# Patient Record
Sex: Female | Born: 1940
Health system: Southern US, Community
[De-identification: ages and names within clinical notes are randomized; demographics above are authoritative.]

## PROBLEM LIST (undated history)

## (undated) DIAGNOSIS — I10 Essential (primary) hypertension: Secondary | ICD-10-CM

## (undated) DIAGNOSIS — E7800 Pure hypercholesterolemia, unspecified: Secondary | ICD-10-CM

## (undated) DIAGNOSIS — E039 Hypothyroidism, unspecified: Secondary | ICD-10-CM

## (undated) HISTORY — DX: Hypothyroidism, unspecified: E03.9

## (undated) HISTORY — DX: Essential (primary) hypertension: I10

## (undated) HISTORY — PX: CATARACT EXTRACTION: SUR2

## (undated) HISTORY — DX: Pure hypercholesterolemia, unspecified: E78.00

---

## 2002-04-10 ENCOUNTER — Ambulatory Visit (HOSPITAL_COMMUNITY): Admission: RE | Admit: 2002-04-10 | Discharge: 2002-04-10 | Payer: Self-pay | Admitting: Unknown Physician Specialty

## 2002-04-10 ENCOUNTER — Encounter: Payer: Self-pay | Admitting: Unknown Physician Specialty

## 2003-04-16 ENCOUNTER — Ambulatory Visit (HOSPITAL_COMMUNITY): Admission: RE | Admit: 2003-04-16 | Discharge: 2003-04-16 | Payer: Self-pay | Admitting: Unknown Physician Specialty

## 2003-04-16 ENCOUNTER — Encounter: Payer: Self-pay | Admitting: Unknown Physician Specialty

## 2004-04-18 ENCOUNTER — Ambulatory Visit (HOSPITAL_COMMUNITY): Admission: RE | Admit: 2004-04-18 | Discharge: 2004-04-18 | Payer: Self-pay | Admitting: Unknown Physician Specialty

## 2005-04-20 ENCOUNTER — Ambulatory Visit (HOSPITAL_COMMUNITY): Admission: RE | Admit: 2005-04-20 | Discharge: 2005-04-20 | Payer: Self-pay | Admitting: Internal Medicine

## 2006-04-22 ENCOUNTER — Ambulatory Visit (HOSPITAL_COMMUNITY): Admission: RE | Admit: 2006-04-22 | Discharge: 2006-04-22 | Payer: Self-pay | Admitting: Internal Medicine

## 2007-04-24 ENCOUNTER — Ambulatory Visit (HOSPITAL_COMMUNITY): Admission: RE | Admit: 2007-04-24 | Discharge: 2007-04-24 | Payer: Self-pay | Admitting: Internal Medicine

## 2008-04-28 ENCOUNTER — Ambulatory Visit (HOSPITAL_COMMUNITY): Admission: RE | Admit: 2008-04-28 | Discharge: 2008-04-28 | Payer: Self-pay | Admitting: Internal Medicine

## 2009-05-02 ENCOUNTER — Ambulatory Visit (HOSPITAL_COMMUNITY): Admission: RE | Admit: 2009-05-02 | Discharge: 2009-05-02 | Payer: Self-pay | Admitting: Internal Medicine

## 2009-06-13 ENCOUNTER — Ambulatory Visit (HOSPITAL_COMMUNITY): Admission: RE | Admit: 2009-06-13 | Discharge: 2009-06-13 | Payer: Self-pay | Admitting: Ophthalmology

## 2010-05-04 ENCOUNTER — Ambulatory Visit (HOSPITAL_COMMUNITY): Admission: RE | Admit: 2010-05-04 | Discharge: 2010-05-04 | Payer: Self-pay | Admitting: Internal Medicine

## 2010-06-19 ENCOUNTER — Ambulatory Visit (HOSPITAL_COMMUNITY): Admission: RE | Admit: 2010-06-19 | Discharge: 2010-06-19 | Payer: Self-pay | Admitting: Ophthalmology

## 2010-10-10 LAB — BASIC METABOLIC PANEL
Chloride: 106 mEq/L (ref 96–112)
Creatinine, Ser: 0.73 mg/dL (ref 0.4–1.2)
GFR calc Af Amer: >60 mL/min (ref >60)

## 2010-10-10 LAB — HEMOGLOBIN AND HEMATOCRIT, BLOOD
HCT: 39.9 % (ref 36.0–46.0)
Hemoglobin: 13.5 g/dL (ref 12.0–15.0)

## 2010-11-01 LAB — HEMOGLOBIN AND HEMATOCRIT, BLOOD: Hemoglobin: 13.7 g/dL (ref 12.0–15.0)

## 2010-11-01 LAB — BASIC METABOLIC PANEL
BUN: 11 mg/dL (ref 6–23)
Creatinine, Ser: 0.73 mg/dL (ref 0.4–1.2)
GFR calc non Af Amer: >60 mL/min (ref >60)
Potassium: 4 mEq/L (ref 3.5–5.1)

## 2011-04-02 ENCOUNTER — Other Ambulatory Visit (HOSPITAL_COMMUNITY): Payer: Self-pay | Admitting: Internal Medicine

## 2011-04-02 DIAGNOSIS — Z139 Encounter for screening, unspecified: Secondary | ICD-10-CM

## 2011-05-08 ENCOUNTER — Ambulatory Visit (HOSPITAL_COMMUNITY)
Admission: RE | Admit: 2011-05-08 | Discharge: 2011-05-08 | Disposition: A | Discharge status: Home / Self Care | Payer: Medicare Other | Source: Ambulatory Visit | Attending: Internal Medicine | Admitting: Internal Medicine

## 2011-05-08 DIAGNOSIS — Z139 Encounter for screening, unspecified: Secondary | ICD-10-CM

## 2011-05-08 DIAGNOSIS — Z1231 Encounter for screening mammogram for malignant neoplasm of breast: Secondary | ICD-10-CM | POA: Insufficient documentation

## 2012-04-03 ENCOUNTER — Other Ambulatory Visit (HOSPITAL_COMMUNITY): Payer: Self-pay | Admitting: Internal Medicine

## 2012-04-03 DIAGNOSIS — Z139 Encounter for screening, unspecified: Secondary | ICD-10-CM

## 2012-05-12 ENCOUNTER — Ambulatory Visit (HOSPITAL_COMMUNITY)
Admission: RE | Admit: 2012-05-12 | Discharge: 2012-05-12 | Disposition: A | Discharge status: Home / Self Care | Payer: Medicare Other | Source: Ambulatory Visit | Attending: Internal Medicine | Admitting: Internal Medicine

## 2012-05-12 DIAGNOSIS — Z139 Encounter for screening, unspecified: Secondary | ICD-10-CM

## 2012-05-12 DIAGNOSIS — Z1231 Encounter for screening mammogram for malignant neoplasm of breast: Secondary | ICD-10-CM | POA: Insufficient documentation

## 2012-05-16 ENCOUNTER — Other Ambulatory Visit: Payer: Self-pay | Admitting: Internal Medicine

## 2012-05-16 DIAGNOSIS — R928 Other abnormal and inconclusive findings on diagnostic imaging of breast: Secondary | ICD-10-CM

## 2012-06-04 ENCOUNTER — Ambulatory Visit (HOSPITAL_COMMUNITY)
Admission: RE | Admit: 2012-06-04 | Discharge: 2012-06-04 | Disposition: A | Discharge status: Home / Self Care | Payer: Medicare Other | Source: Ambulatory Visit | Attending: Internal Medicine | Admitting: Internal Medicine

## 2012-06-04 DIAGNOSIS — R928 Other abnormal and inconclusive findings on diagnostic imaging of breast: Secondary | ICD-10-CM | POA: Insufficient documentation

## 2012-11-03 ENCOUNTER — Other Ambulatory Visit (HOSPITAL_COMMUNITY): Payer: Self-pay | Admitting: Internal Medicine

## 2012-11-03 DIAGNOSIS — Z139 Encounter for screening, unspecified: Secondary | ICD-10-CM

## 2012-11-03 DIAGNOSIS — Z09 Encounter for follow-up examination after completed treatment for conditions other than malignant neoplasm: Secondary | ICD-10-CM

## 2012-12-10 ENCOUNTER — Ambulatory Visit (HOSPITAL_COMMUNITY)
Admission: RE | Admit: 2012-12-10 | Discharge: 2012-12-10 | Disposition: A | Discharge status: Home / Self Care | Payer: Medicare Other | Source: Ambulatory Visit | Attending: Internal Medicine | Admitting: Internal Medicine

## 2012-12-10 DIAGNOSIS — R928 Other abnormal and inconclusive findings on diagnostic imaging of breast: Secondary | ICD-10-CM | POA: Insufficient documentation

## 2012-12-10 DIAGNOSIS — Z09 Encounter for follow-up examination after completed treatment for conditions other than malignant neoplasm: Secondary | ICD-10-CM

## 2013-05-04 ENCOUNTER — Other Ambulatory Visit (HOSPITAL_COMMUNITY): Payer: Self-pay | Admitting: Internal Medicine

## 2013-05-04 DIAGNOSIS — R928 Other abnormal and inconclusive findings on diagnostic imaging of breast: Secondary | ICD-10-CM

## 2013-05-04 DIAGNOSIS — Z09 Encounter for follow-up examination after completed treatment for conditions other than malignant neoplasm: Secondary | ICD-10-CM

## 2013-06-17 ENCOUNTER — Ambulatory Visit (HOSPITAL_COMMUNITY)
Admission: RE | Admit: 2013-06-17 | Discharge: 2013-06-17 | Disposition: A | Discharge status: Home / Self Care | Payer: Medicare Other | Source: Ambulatory Visit | Attending: Internal Medicine | Admitting: Internal Medicine

## 2013-06-17 DIAGNOSIS — Z09 Encounter for follow-up examination after completed treatment for conditions other than malignant neoplasm: Secondary | ICD-10-CM | POA: Insufficient documentation

## 2013-06-17 DIAGNOSIS — R928 Other abnormal and inconclusive findings on diagnostic imaging of breast: Secondary | ICD-10-CM

## 2014-05-18 ENCOUNTER — Other Ambulatory Visit (HOSPITAL_COMMUNITY): Payer: Self-pay | Admitting: Internal Medicine

## 2014-05-18 DIAGNOSIS — Z09 Encounter for follow-up examination after completed treatment for conditions other than malignant neoplasm: Secondary | ICD-10-CM

## 2014-05-18 DIAGNOSIS — R921 Mammographic calcification found on diagnostic imaging of breast: Secondary | ICD-10-CM

## 2014-06-29 ENCOUNTER — Ambulatory Visit (HOSPITAL_COMMUNITY)
Admission: RE | Admit: 2014-06-29 | Discharge: 2014-06-29 | Disposition: A | Discharge status: Home / Self Care | Payer: Medicare Other | Source: Ambulatory Visit | Attending: Internal Medicine | Admitting: Internal Medicine

## 2014-06-29 DIAGNOSIS — R921 Mammographic calcification found on diagnostic imaging of breast: Secondary | ICD-10-CM | POA: Insufficient documentation

## 2014-06-29 DIAGNOSIS — Z09 Encounter for follow-up examination after completed treatment for conditions other than malignant neoplasm: Secondary | ICD-10-CM | POA: Diagnosis present

## 2015-06-08 ENCOUNTER — Other Ambulatory Visit (HOSPITAL_COMMUNITY): Payer: Self-pay | Admitting: Internal Medicine

## 2015-06-08 DIAGNOSIS — Z1231 Encounter for screening mammogram for malignant neoplasm of breast: Secondary | ICD-10-CM

## 2015-07-04 ENCOUNTER — Ambulatory Visit (HOSPITAL_COMMUNITY)
Admission: RE | Admit: 2015-07-04 | Discharge: 2015-07-04 | Disposition: A | Discharge status: Home / Self Care | Payer: Medicare Other | Source: Ambulatory Visit | Attending: Internal Medicine | Admitting: Internal Medicine

## 2015-07-04 DIAGNOSIS — Z1231 Encounter for screening mammogram for malignant neoplasm of breast: Secondary | ICD-10-CM | POA: Diagnosis not present

## 2015-11-01 DIAGNOSIS — Z713 Dietary counseling and surveillance: Secondary | ICD-10-CM | POA: Diagnosis not present

## 2015-11-01 DIAGNOSIS — Z6829 Body mass index (BMI) 29.0-29.9, adult: Secondary | ICD-10-CM | POA: Diagnosis not present

## 2015-11-01 DIAGNOSIS — R21 Rash and other nonspecific skin eruption: Secondary | ICD-10-CM | POA: Diagnosis not present

## 2015-11-01 DIAGNOSIS — Z87891 Personal history of nicotine dependence: Secondary | ICD-10-CM | POA: Diagnosis not present

## 2016-01-09 DIAGNOSIS — L239 Allergic contact dermatitis, unspecified cause: Secondary | ICD-10-CM | POA: Diagnosis not present

## 2016-01-09 DIAGNOSIS — Z299 Encounter for prophylactic measures, unspecified: Secondary | ICD-10-CM | POA: Diagnosis not present

## 2016-01-27 DIAGNOSIS — E039 Hypothyroidism, unspecified: Secondary | ICD-10-CM | POA: Diagnosis not present

## 2016-02-03 DIAGNOSIS — E785 Hyperlipidemia, unspecified: Secondary | ICD-10-CM | POA: Diagnosis not present

## 2016-02-03 DIAGNOSIS — I10 Essential (primary) hypertension: Secondary | ICD-10-CM | POA: Diagnosis not present

## 2016-05-31 ENCOUNTER — Other Ambulatory Visit (HOSPITAL_COMMUNITY): Payer: Self-pay | Admitting: Internal Medicine

## 2016-05-31 DIAGNOSIS — Z1231 Encounter for screening mammogram for malignant neoplasm of breast: Secondary | ICD-10-CM

## 2016-06-05 DIAGNOSIS — Z23 Encounter for immunization: Secondary | ICD-10-CM | POA: Diagnosis not present

## 2016-07-05 ENCOUNTER — Ambulatory Visit (HOSPITAL_COMMUNITY)
Admission: RE | Admit: 2016-07-05 | Discharge: 2016-07-05 | Disposition: A | Discharge status: Home / Self Care | Payer: Medicare Other | Source: Ambulatory Visit | Attending: Internal Medicine | Admitting: Internal Medicine

## 2016-07-05 DIAGNOSIS — Z1231 Encounter for screening mammogram for malignant neoplasm of breast: Secondary | ICD-10-CM | POA: Diagnosis not present

## 2016-08-03 DIAGNOSIS — Z6829 Body mass index (BMI) 29.0-29.9, adult: Secondary | ICD-10-CM | POA: Diagnosis not present

## 2016-08-03 DIAGNOSIS — Z7189 Other specified counseling: Secondary | ICD-10-CM | POA: Diagnosis not present

## 2016-08-03 DIAGNOSIS — Z299 Encounter for prophylactic measures, unspecified: Secondary | ICD-10-CM | POA: Diagnosis not present

## 2016-08-03 DIAGNOSIS — Z1211 Encounter for screening for malignant neoplasm of colon: Secondary | ICD-10-CM | POA: Diagnosis not present

## 2016-08-03 DIAGNOSIS — Z1389 Encounter for screening for other disorder: Secondary | ICD-10-CM | POA: Diagnosis not present

## 2016-08-03 DIAGNOSIS — Z0000 Encounter for general adult medical examination without abnormal findings: Secondary | ICD-10-CM | POA: Diagnosis not present

## 2016-08-03 DIAGNOSIS — Z79899 Other long term (current) drug therapy: Secondary | ICD-10-CM | POA: Diagnosis not present

## 2016-08-03 DIAGNOSIS — E785 Hyperlipidemia, unspecified: Secondary | ICD-10-CM | POA: Diagnosis not present

## 2016-08-03 DIAGNOSIS — E039 Hypothyroidism, unspecified: Secondary | ICD-10-CM | POA: Diagnosis not present

## 2016-08-10 DIAGNOSIS — E2839 Other primary ovarian failure: Secondary | ICD-10-CM | POA: Diagnosis not present

## 2016-11-02 DIAGNOSIS — Z79899 Other long term (current) drug therapy: Secondary | ICD-10-CM | POA: Diagnosis not present

## 2017-01-29 DIAGNOSIS — E039 Hypothyroidism, unspecified: Secondary | ICD-10-CM | POA: Diagnosis not present

## 2017-03-06 DIAGNOSIS — E039 Hypothyroidism, unspecified: Secondary | ICD-10-CM | POA: Diagnosis not present

## 2017-03-06 DIAGNOSIS — Z6828 Body mass index (BMI) 28.0-28.9, adult: Secondary | ICD-10-CM | POA: Diagnosis not present

## 2017-03-06 DIAGNOSIS — Z299 Encounter for prophylactic measures, unspecified: Secondary | ICD-10-CM | POA: Diagnosis not present

## 2017-03-06 DIAGNOSIS — I10 Essential (primary) hypertension: Secondary | ICD-10-CM | POA: Diagnosis not present

## 2017-03-06 DIAGNOSIS — E785 Hyperlipidemia, unspecified: Secondary | ICD-10-CM | POA: Diagnosis not present

## 2017-03-13 DIAGNOSIS — I10 Essential (primary) hypertension: Secondary | ICD-10-CM | POA: Diagnosis not present

## 2017-03-13 DIAGNOSIS — E785 Hyperlipidemia, unspecified: Secondary | ICD-10-CM | POA: Diagnosis not present

## 2017-03-13 DIAGNOSIS — Z299 Encounter for prophylactic measures, unspecified: Secondary | ICD-10-CM | POA: Diagnosis not present

## 2017-03-13 DIAGNOSIS — E039 Hypothyroidism, unspecified: Secondary | ICD-10-CM | POA: Diagnosis not present

## 2017-03-13 DIAGNOSIS — R195 Other fecal abnormalities: Secondary | ICD-10-CM | POA: Diagnosis not present

## 2017-03-13 DIAGNOSIS — Z6828 Body mass index (BMI) 28.0-28.9, adult: Secondary | ICD-10-CM | POA: Diagnosis not present

## 2017-03-15 ENCOUNTER — Encounter (INDEPENDENT_AMBULATORY_CARE_PROVIDER_SITE_OTHER): Payer: Self-pay | Admitting: *Deleted

## 2017-03-27 ENCOUNTER — Encounter (INDEPENDENT_AMBULATORY_CARE_PROVIDER_SITE_OTHER): Payer: Self-pay

## 2017-03-27 ENCOUNTER — Encounter (INDEPENDENT_AMBULATORY_CARE_PROVIDER_SITE_OTHER): Payer: Self-pay | Admitting: Internal Medicine

## 2017-04-10 ENCOUNTER — Other Ambulatory Visit (INDEPENDENT_AMBULATORY_CARE_PROVIDER_SITE_OTHER): Payer: Self-pay | Admitting: Internal Medicine

## 2017-04-10 ENCOUNTER — Encounter (INDEPENDENT_AMBULATORY_CARE_PROVIDER_SITE_OTHER): Payer: Self-pay | Admitting: Internal Medicine

## 2017-04-10 ENCOUNTER — Telehealth (INDEPENDENT_AMBULATORY_CARE_PROVIDER_SITE_OTHER): Payer: Self-pay | Admitting: *Deleted

## 2017-04-10 ENCOUNTER — Ambulatory Visit (INDEPENDENT_AMBULATORY_CARE_PROVIDER_SITE_OTHER): Payer: Medicare Other | Admitting: Internal Medicine

## 2017-04-10 ENCOUNTER — Encounter (INDEPENDENT_AMBULATORY_CARE_PROVIDER_SITE_OTHER): Payer: Self-pay | Admitting: *Deleted

## 2017-04-10 VITALS — BP 150/66 | HR 80 | Temp 98.6°F | Ht 65.0 in | Wt 166.6 lb

## 2017-04-10 DIAGNOSIS — R195 Other fecal abnormalities: Secondary | ICD-10-CM | POA: Insufficient documentation

## 2017-04-10 DIAGNOSIS — E039 Hypothyroidism, unspecified: Secondary | ICD-10-CM

## 2017-04-10 DIAGNOSIS — E7800 Pure hypercholesterolemia, unspecified: Secondary | ICD-10-CM

## 2017-04-10 DIAGNOSIS — I10 Essential (primary) hypertension: Secondary | ICD-10-CM

## 2017-04-10 HISTORY — DX: Pure hypercholesterolemia, unspecified: E78.00

## 2017-04-10 HISTORY — DX: Essential (primary) hypertension: I10

## 2017-04-10 HISTORY — DX: Hypothyroidism, unspecified: E03.9

## 2017-04-10 MED ORDER — PEG 3350-KCL-NA BICARB-NACL 420 G PO SOLR: 4000.0000 mL | Freq: Once | ORAL | 0 refills | Status: AC

## 2017-04-10 NOTE — Patient Instructions (Signed)
Colonoscopy. The risks of bleeding, perforation and infection were reviewed with patient.  

## 2017-04-10 NOTE — Progress Notes (Signed)
   Subjective:    Patient ID: April Duran, female    DOB: 1940-12-27, 76 y.o.   MRN: [id number]  HPI Referred by Dr. Manuella Ghazi for heme positive stool. She denies seeing any blood. No change in her stools. No family hx of colon cancer. No weight loss. Appetite is good. Has never undergone a colonoscopy in the past. She did have a rectal exam at Dr. Trena Platt Arsenio Katz NP) and was positive. She takes a Baby ASA daily. She was taking Ibuprofen x 4 while her husband was in the [place] in July.     Review of Systems Past Medical History:  Diagnosis Date  . Essential hypertension, benign 04/10/2017  . High cholesterol 04/10/2017  . Hypothyroidism 04/10/2017    No past surgical history on file.  Allergies no known allergies  No current outpatient prescriptions on file prior to visit.   No current facility-administered medications on file prior to visit.    Current Outpatient Prescriptions  Medication Sig Dispense Refill  . Ascorbic Acid (VITAMIN C) 1000 MG tablet Take 1,000 mg by mouth daily.    Marland Kitchen aspirin 81 MG tablet Take 81 mg by mouth daily.    Marland Kitchen b complex vitamins tablet Take 1 tablet by mouth daily.    . Biotin 10 MG CAPS Take by mouth.    . Calcium Carbonate-Vit D-Min (CALCIUM 1200 PO) Take 600 mg by mouth.    . cholecalciferol (VITAMIN D) 1000 units tablet Take 2,000 Units by mouth daily.    Marland Kitchen docusate sodium (COLACE) 100 MG capsule Take 100 mg by mouth 2 (two) times daily.    Marland Kitchen levothyroxine (SYNTHROID, LEVOTHROID) 50 MCG tablet Take 50 mcg by mouth daily before breakfast.    . lisinopril (PRINIVIL,ZESTRIL) 20 MG tablet Take 20 mg by mouth daily.    Marland Kitchen lovastatin (MEVACOR) 20 MG tablet Take 20 mg by mouth at bedtime.    . Multiple Vitamin (MULTIVITAMIN) tablet Take 1 tablet by mouth daily.    . Omega-3 Fatty Acids (FISH OIL) 1200 MG CAPS Take by mouth.    . senna (SENOKOT) 8.6 MG tablet Take 1 tablet by mouth daily.    . vitamin B-12 (CYANOCOBALAMIN) 100 MCG tablet Take 5,000 mcg by  mouth daily.    . vitamin E 400 UNIT capsule Take 400 Units by mouth daily.     No current facility-administered medications for this visit.          Objective:   Physical Exam Blood pressure (!) 150/66, pulse 80, temperature 98.6 F (37 C), height 5\' 5"  (1.651 m), weight 166 lb 9.6 oz (75.6 kg). Alert and oriented. Skin warm and dry. Oral mucosa is moist.   . Sclera anicteric, conjunctivae is pink. Thyroid not enlarged. No cervical lymphadenopathy. Lungs clear. Heart regular rate and rhythm.  Abdomen is soft. Bowel sounds are positive. No hepatomegaly. No abdominal masses felt. No tenderness.  No edema to lower extremities.          Assessment & Plan:  Guaiac positive. Colonic neoplasm, polyp, AVM needs to be ruled out.

## 2017-04-10 NOTE — Telephone Encounter (Signed)
Patient needs trilyte 

## 2017-04-18 ENCOUNTER — Encounter (HOSPITAL_COMMUNITY)
Admission: RE | Disposition: A | Discharge status: Home / Self Care | Payer: Self-pay | Source: Ambulatory Visit | Attending: Internal Medicine

## 2017-04-18 ENCOUNTER — Encounter (HOSPITAL_COMMUNITY): Payer: Self-pay | Admitting: *Deleted

## 2017-04-18 ENCOUNTER — Ambulatory Visit (HOSPITAL_COMMUNITY)
Admission: RE | Admit: 2017-04-18 | Discharge: 2017-04-18 | Disposition: A | Discharge status: Home / Self Care | Payer: Medicare Other | Source: Ambulatory Visit | Attending: Internal Medicine | Admitting: Internal Medicine

## 2017-04-18 DIAGNOSIS — K5730 Diverticulosis of large intestine without perforation or abscess without bleeding: Secondary | ICD-10-CM | POA: Diagnosis not present

## 2017-04-18 DIAGNOSIS — R195 Other fecal abnormalities: Secondary | ICD-10-CM | POA: Insufficient documentation

## 2017-04-18 DIAGNOSIS — D122 Benign neoplasm of ascending colon: Secondary | ICD-10-CM | POA: Insufficient documentation

## 2017-04-18 DIAGNOSIS — K6289 Other specified diseases of anus and rectum: Secondary | ICD-10-CM | POA: Diagnosis not present

## 2017-04-18 DIAGNOSIS — Z79899 Other long term (current) drug therapy: Secondary | ICD-10-CM | POA: Insufficient documentation

## 2017-04-18 DIAGNOSIS — K6389 Other specified diseases of intestine: Secondary | ICD-10-CM | POA: Diagnosis not present

## 2017-04-18 DIAGNOSIS — E039 Hypothyroidism, unspecified: Secondary | ICD-10-CM | POA: Diagnosis not present

## 2017-04-18 DIAGNOSIS — E7800 Pure hypercholesterolemia, unspecified: Secondary | ICD-10-CM | POA: Diagnosis not present

## 2017-04-18 DIAGNOSIS — K644 Residual hemorrhoidal skin tags: Secondary | ICD-10-CM | POA: Diagnosis not present

## 2017-04-18 DIAGNOSIS — I10 Essential (primary) hypertension: Secondary | ICD-10-CM | POA: Diagnosis not present

## 2017-04-18 DIAGNOSIS — Z7982 Long term (current) use of aspirin: Secondary | ICD-10-CM | POA: Insufficient documentation

## 2017-04-18 HISTORY — PX: COLONOSCOPY: SHX5424

## 2017-04-18 HISTORY — PX: POLYPECTOMY: SHX5525

## 2017-04-18 SURGERY — COLONOSCOPY
Anesthesia: Moderate Sedation

## 2017-04-18 MED ORDER — MIDAZOLAM HCL 5 MG/5ML IJ SOLN
INTRAMUSCULAR | Status: DC | PRN
  Administered 2017-04-18: 1 mg via INTRAVENOUS
  Administered 2017-04-18: 2 mg via INTRAVENOUS
  Administered 2017-04-18: 1 mg via INTRAVENOUS

## 2017-04-18 MED ORDER — SODIUM CHLORIDE 0.9 % IV SOLN
INTRAVENOUS | Status: DC
Start: 2017-04-18 — End: 2017-04-18
  Administered 2017-04-18: 10:00:00 via INTRAVENOUS

## 2017-04-18 MED ORDER — MEPERIDINE HCL 50 MG/ML IJ SOLN
INTRAMUSCULAR | Status: DC | PRN
  Administered 2017-04-18 (×2): 25 mg via INTRAVENOUS

## 2017-04-18 MED ORDER — MEPERIDINE HCL 50 MG/ML IJ SOLN
INTRAMUSCULAR | Status: AC
  Filled 2017-04-18: qty 1

## 2017-04-18 MED ORDER — MIDAZOLAM HCL 5 MG/5ML IJ SOLN
INTRAMUSCULAR | Status: AC
  Filled 2017-04-18: qty 10

## 2017-04-18 NOTE — Op Note (Signed)
[facility] Patient Name: April Duran Procedure Date: 04/18/2017 9:48 AM MRN: [id number] Date of Birth: Mar 07, 1941 Attending MD: Hildred Laser , MD CSN: [id number] Age: 76 Admit Type: Outpatient Procedure:                Colonoscopy Indications:              Heme positive stool Providers:                Hildred Laser, MD, Lurline Del, RN, Zoila Shutter,                            Technologist Referring MD:             Fuller Canada Manuella Ghazi, MD Medicines:                Meperidine 50 mg IV, Midazolam 4 mg IV Complications:            No immediate complications. Estimated Blood Loss:     Estimated blood loss was minimal. Procedure:                Pre-Anesthesia Assessment:                           - Prior to the procedure, a History and Physical                            was performed, and patient medications and                            allergies were reviewed. The patient's tolerance of                            previous anesthesia was also reviewed. The risks                            and benefits of the procedure and the sedation                            options and risks were discussed with the patient.                            All questions were answered, and informed consent                            was obtained. Prior Anticoagulants: The patient                            last took aspirin 3 days prior to the procedure.                            ASA Grade Assessment: II - A patient with mild                            systemic disease. After reviewing the risks and  benefits, the patient was deemed in satisfactory                            condition to undergo the procedure.                           After obtaining informed consent, the colonoscope                            was passed under direct vision. Throughout the                            procedure, the patient's blood pressure, pulse, and                            oxygen  saturations were monitored continuously. The                            [id number] ([id number]) scope was introduced through                            the anus and advanced to the the cecum, identified                            by appendiceal orifice and ileocecal valve. The                            colonoscopy was performed without difficulty. The                            patient tolerated the procedure well. The quality                            of the bowel preparation was excellent. The                            ileocecal valve, appendiceal orifice, and rectum                            were photographed. Scope In: 10:13:07 AM Scope Out: 10:35:39 AM Scope Withdrawal Time: 0 hours 11 minutes 41 seconds  Total Procedure Duration: 0 hours 22 minutes 32 seconds  Findings:      The perianal and digital rectal examinations were normal.      A diffuse area of mild melanosis was found in the entire colon.      A small polyp was found in the ascending colon. The polyp was sessile.       Biopsies were taken with a cold forceps for histology.      A few small-mouthed diverticula were found in the sigmoid colon.      External hemorrhoids were found during retroflexion. The hemorrhoids       were small.      Anal papilla(e) were hypertrophied. Impression:               - Melanosis in the colon.                           -  One small polyp in the ascending colon. Biopsied.                           - Diverticulosis in the sigmoid colon.                           - External hemorrhoids.                           - Anal papilla(e) were hypertrophied. Moderate Sedation:      Moderate (conscious) sedation was administered by the endoscopy nurse       and supervised by the endoscopist. The following parameters were       monitored: oxygen saturation, heart rate, blood pressure, CO2       capnography and response to care. Total physician intraservice time was       30 minutes. Recommendation:            - Patient has a contact number available for                            emergencies. The signs and symptoms of potential                            delayed complications were discussed with the                            patient. Return to normal activities tomorrow.                            Written discharge instructions were provided to the                            patient.                           - High fiber diet today.                           - Continue present medications.                           - Resume aspirin at prior dose tomorrow.                           - Await pathology results.                           - No recommendation at this time regarding repeat                            colonoscopy. Procedure Code(s):        --- Professional ---                           [phone], Colonoscopy, flexible; with biopsy, single  or multiple                           99152, Moderate sedation services provided by the                            same physician or other qualified health care                            professional performing the diagnostic or                            therapeutic service that the sedation supports,                            requiring the presence of an independent trained                            observer to assist in the monitoring of the                            patient's level of consciousness and physiological                            status; initial 15 minutes of intraservice time,                            patient age 58 years or older                           [phone], Moderate sedation services; each additional                            15 minutes intraservice time Diagnosis Code(s):        --- Professional ---                           K63.89, Other specified diseases of intestine                           D12.2, Benign neoplasm of ascending colon                           K64.4, Residual  hemorrhoidal skin tags                           K62.89, Other specified diseases of anus and rectum                           R19.5, Other fecal abnormalities                           K57.30, Diverticulosis of large intestine without                            perforation or abscess  without bleeding CPT copyright 2016 American [employer] Association. All rights reserved. The codes documented in this report are preliminary and upon coder review may  be revised to meet current compliance requirements. Hildred Laser, MD Hildred Laser, MD 04/18/2017 10:47:42 AM This report has been signed electronically. Number of Addenda: 0

## 2017-04-18 NOTE — Discharge Instructions (Signed)
Colon Polyps Polyps are tissue growths inside the body. Polyps can grow in many places, including the large intestine (colon). A polyp may be a round bump or a mushroom-shaped growth. You could have one polyp or several. Most colon polyps are noncancerous (benign). However, some colon polyps can become cancerous over time. What are the causes? The exact cause of colon polyps is not known. What increases the risk? This condition is more likely to develop in people who:  Have a family history of colon cancer or colon polyps.  Are older than 34 or older than 45 if they are African American.  Have inflammatory bowel disease, such as ulcerative colitis or Crohn disease.  Are overweight.  Smoke cigarettes.  Do not get enough exercise.  Drink too much alcohol.  Eat a diet that is: ? High in fat and red meat. ? Low in fiber.  Had childhood cancer that was treated with abdominal radiation.  What are the signs or symptoms? Most polyps do not cause symptoms. If you have symptoms, they may include:  Blood coming from your rectum when having a bowel movement.  Blood in your stool.The stool may look dark red or black.  A change in bowel habits, such as constipation or diarrhea.  How is this diagnosed? This condition is diagnosed with a colonoscopy. This is a procedure that uses a lighted, flexible scope to look at the inside of your colon. How is this treated? Treatment for this condition involves removing any polyps that are found. Those polyps will then be tested for cancer. If cancer is found, your health care provider will talk to you about options for colon cancer treatment. Follow these instructions at home: Diet  Eat plenty of fiber, such as fruits, vegetables, and whole grains.  Eat foods that are high in calcium and vitamin D, such as milk, cheese, yogurt, eggs, liver, fish, and broccoli.  Limit foods high in fat, red meats, and processed meats, such as hot dogs, sausage,  bacon, and lunch meats.  Maintain a healthy weight, or lose weight if recommended by your health care provider. General instructions  Do not smoke cigarettes.  Do not drink alcohol excessively.  Keep all follow-up visits as told by your health care provider. This is important. This includes keeping regularly scheduled colonoscopies. Talk to your health care provider about when you need a colonoscopy.  Exercise every day or as told by your health care provider. Contact a health care provider if:  You have new or worsening bleeding during a bowel movement.  You have new or increased blood in your stool.  You have a change in bowel habits.  You unexpectedly lose weight. This information is not intended to replace advice given to you by your health care provider. Make sure you discuss any questions you have with your health care provider. Document Released: 04/11/2004 Document Revised: 12/22/2015 Document Reviewed: 06/06/2015 Elsevier Interactive Patient Education  2018 [employer]. Colonoscopy, Adult, Care After This sheet gives you information about how to care for yourself after your procedure. Your health care provider may also give you more specific instructions. If you have problems or questions, contact your health care provider. What can I expect after the procedure? After the procedure, it is common to have:  A small amount of blood in your stool for 24 hours after the procedure.  Some gas.  Mild abdominal cramping or bloating.  Follow these instructions at home: General instructions   For the first 24 hours after  the procedure: ? Do not drive or use machinery. ? Do not sign important documents. ? Do not drink alcohol. ? Do your regular daily activities at a slower pace than normal. ? Eat soft, easy-to-digest foods. ? Rest often.  Take over-the-counter or prescription medicines only as told by your health care provider.  It is up to you to get the results of  your procedure. Ask your health care provider, or the department performing the procedure, when your results will be ready. Relieving cramping and bloating  Try walking around when you have cramps or feel bloated.  Apply heat to your abdomen as told by your health care provider. Use a heat source that your health care provider recommends, such as a moist heat pack or a heating pad. ? Place a towel between your skin and the heat source. ? Leave the heat on for 20-30 minutes. ? Remove the heat if your skin turns bright red. This is especially important if you are unable to feel pain, heat, or cold. You may have a greater risk of getting burned. Eating and drinking  Drink enough fluid to keep your urine clear or pale yellow.  Resume your normal diet as instructed by your health care provider. Avoid heavy or fried foods that are hard to digest.  Avoid drinking alcohol for as long as instructed by your health care provider. Contact a health care provider if:  You have blood in your stool 2-3 days after the procedure. Get help right away if:  You have more than a small spotting of blood in your stool.  You pass large blood clots in your stool.  Your abdomen is swollen.  You have nausea or vomiting.  You have a fever.  You have increasing abdominal pain that is not relieved with medicine. This information is not intended to replace advice given to you by your health care provider. Make sure you discuss any questions you have with your health care provider. Document Released: 02/28/2004 Document Revised: 04/09/2016 Document Reviewed: 09/27/2015 Elsevier Interactive Patient Education  2018 [employer]. Resume aspirin on 04/19/2017. Resume other medications as before. High fiber diet. No driving for 24 hours. Physician will call with biopsy results.

## 2017-04-18 NOTE — H&P (Signed)
April Duran is an 76 y.o. female.   Chief Complaint: patient is here for colonoscopy. HPI: patient is 76 year old Caucasian female who was noted to have heme-positive stool on routine testing and is therefore undergoing diagnostic colonoscopy. She denies abdominal pain diarrhea melena or rectal bleeding. She recalls she had been taking up to 4 Advil a day while her [place] was in [place] because of back pain. She's also on low-dose aspirin.she denies weakness or postural symptoms.  She has never been screened for CRC. Family history is negative for CRC.  Past Medical History:  Diagnosis Date  . Essential hypertension, benign 04/10/2017  . High cholesterol 04/10/2017  . Hypothyroidism 04/10/2017    Past Surgical History:  Procedure Laterality Date  . CATARACT EXTRACTION     bilateral    History reviewed. No pertinent family history. Social History:  reports that she has never smoked. She has never used smokeless tobacco. She reports that she does not drink alcohol or use drugs.  Allergies: No Known Allergies  Medications Prior to Admission  Medication Sig Dispense Refill  . Ascorbic Acid (VITAMIN C) 1000 MG tablet Take 1,000 mg by mouth daily.    Marland Kitchen aspirin 81 MG tablet Take 81 mg by mouth daily.    Marland Kitchen b complex vitamins tablet Take 1 tablet by mouth daily.    . Biotin 10 MG CAPS Take 10 mg by mouth daily.     . Calcium Carbonate-Vit D-Min (CALCIUM 1200) 1200-1000 MG-UNIT CHEW Chew 2 mg by mouth every morning.     . Coenzyme Q10 (COQ10) 100 MG CAPS Take 100 mg by mouth daily.    . Cyanocobalamin (VITAMIN B12) 1000 MCG TBCR Take 5,000 mcg by mouth daily.    Marland Kitchen levothyroxine (SYNTHROID, LEVOTHROID) 50 MCG tablet Take 25-50 mcg by mouth every other day. 66mcg, 70mcg,25mcg....    . lisinopril (PRINIVIL,ZESTRIL) 20 MG tablet Take 20 mg by mouth daily.    Marland Kitchen lovastatin (MEVACOR) 20 MG tablet Take 20 mg by mouth at bedtime.    . Multiple Vitamin (MULTIVITAMIN) tablet Take 1 tablet by mouth  daily.    . Omega-3 Fatty Acids (FISH OIL) 1200 MG CAPS Take 2,400 mg by mouth 2 (two) times daily. With Vit D3    . senna (SENOKOT) 8.6 MG tablet Take 2 tablets by mouth at bedtime.     . vitamin E 400 UNIT capsule Take 400 Units by mouth daily.      No results found for this or any previous visit (from the past 48 hour(s)). No results found.  ROS  Blood pressure (!) 158/73, pulse 86, temperature 98 F (36.7 C), temperature source Oral, resp. rate (!) 23, SpO2 98 %. Physical Exam  Constitutional: She appears well-developed and well-nourished.  HENT:  Mouth/Throat: Oropharynx is clear and moist.  Eyes: Conjunctivae are normal. No scleral icterus.  Neck: No thyromegaly present.  Cardiovascular: Normal rate, regular rhythm and normal heart sounds.   No murmur heard. Respiratory: Effort normal and breath sounds normal.  GI: Soft. She exhibits no distension and no mass. There is no tenderness.  Musculoskeletal: She exhibits no edema.  Lymphadenopathy:    She has no cervical adenopathy.  Neurological: She is alert.  Skin: Skin is warm and dry.     Assessment/Plan Heme-positive stool. Diagnostic colonoscopy.  Hildred Laser, MD 04/18/2017, 10:02 AM

## 2017-04-22 ENCOUNTER — Encounter (HOSPITAL_COMMUNITY): Payer: Self-pay | Admitting: Internal Medicine

## 2017-05-05 DIAGNOSIS — Z23 Encounter for immunization: Secondary | ICD-10-CM | POA: Diagnosis not present

## 2017-06-12 ENCOUNTER — Other Ambulatory Visit (HOSPITAL_COMMUNITY): Payer: Self-pay | Admitting: Internal Medicine

## 2017-06-12 DIAGNOSIS — Z1231 Encounter for screening mammogram for malignant neoplasm of breast: Secondary | ICD-10-CM

## 2017-07-08 ENCOUNTER — Ambulatory Visit (HOSPITAL_COMMUNITY): Payer: Medicare Other

## 2017-07-12 ENCOUNTER — Ambulatory Visit (HOSPITAL_COMMUNITY)
Admission: RE | Admit: 2017-07-12 | Discharge: 2017-07-12 | Disposition: A | Discharge status: Home / Self Care | Payer: Medicare Other | Source: Ambulatory Visit | Attending: Internal Medicine | Admitting: Internal Medicine

## 2017-07-12 DIAGNOSIS — Z1231 Encounter for screening mammogram for malignant neoplasm of breast: Secondary | ICD-10-CM | POA: Diagnosis not present

## 2017-08-07 DIAGNOSIS — Z1331 Encounter for screening for depression: Secondary | ICD-10-CM | POA: Diagnosis not present

## 2017-08-07 DIAGNOSIS — Z0000 Encounter for general adult medical examination without abnormal findings: Secondary | ICD-10-CM | POA: Diagnosis not present

## 2017-08-07 DIAGNOSIS — E785 Hyperlipidemia, unspecified: Secondary | ICD-10-CM | POA: Diagnosis not present

## 2017-08-07 DIAGNOSIS — Z7189 Other specified counseling: Secondary | ICD-10-CM | POA: Diagnosis not present

## 2017-08-07 DIAGNOSIS — Z1339 Encounter for screening examination for other mental health and behavioral disorders: Secondary | ICD-10-CM | POA: Diagnosis not present

## 2017-08-07 DIAGNOSIS — Z79899 Other long term (current) drug therapy: Secondary | ICD-10-CM | POA: Diagnosis not present

## 2017-08-07 DIAGNOSIS — Z6829 Body mass index (BMI) 29.0-29.9, adult: Secondary | ICD-10-CM | POA: Diagnosis not present

## 2017-08-07 DIAGNOSIS — E039 Hypothyroidism, unspecified: Secondary | ICD-10-CM | POA: Diagnosis not present

## 2017-08-07 DIAGNOSIS — I10 Essential (primary) hypertension: Secondary | ICD-10-CM | POA: Diagnosis not present

## 2017-08-07 DIAGNOSIS — R5383 Other fatigue: Secondary | ICD-10-CM | POA: Diagnosis not present

## 2017-08-07 DIAGNOSIS — Z299 Encounter for prophylactic measures, unspecified: Secondary | ICD-10-CM | POA: Diagnosis not present

## 2018-02-04 DIAGNOSIS — Z6829 Body mass index (BMI) 29.0-29.9, adult: Secondary | ICD-10-CM | POA: Diagnosis not present

## 2018-02-04 DIAGNOSIS — Z299 Encounter for prophylactic measures, unspecified: Secondary | ICD-10-CM | POA: Diagnosis not present

## 2018-02-04 DIAGNOSIS — Z713 Dietary counseling and surveillance: Secondary | ICD-10-CM | POA: Diagnosis not present

## 2018-02-04 DIAGNOSIS — E039 Hypothyroidism, unspecified: Secondary | ICD-10-CM | POA: Diagnosis not present

## 2018-02-04 DIAGNOSIS — I10 Essential (primary) hypertension: Secondary | ICD-10-CM | POA: Diagnosis not present

## 2018-03-07 DIAGNOSIS — Z299 Encounter for prophylactic measures, unspecified: Secondary | ICD-10-CM | POA: Diagnosis not present

## 2018-03-07 DIAGNOSIS — Z6830 Body mass index (BMI) 30.0-30.9, adult: Secondary | ICD-10-CM | POA: Diagnosis not present

## 2018-03-07 DIAGNOSIS — I10 Essential (primary) hypertension: Secondary | ICD-10-CM | POA: Diagnosis not present

## 2018-03-07 DIAGNOSIS — N390 Urinary tract infection, site not specified: Secondary | ICD-10-CM | POA: Diagnosis not present

## 2018-03-07 DIAGNOSIS — E039 Hypothyroidism, unspecified: Secondary | ICD-10-CM | POA: Diagnosis not present

## 2018-03-07 DIAGNOSIS — R350 Frequency of micturition: Secondary | ICD-10-CM | POA: Diagnosis not present

## 2018-03-07 DIAGNOSIS — E785 Hyperlipidemia, unspecified: Secondary | ICD-10-CM | POA: Diagnosis not present

## 2018-06-04 ENCOUNTER — Other Ambulatory Visit (HOSPITAL_COMMUNITY): Payer: Self-pay | Admitting: Nurse Practitioner

## 2018-06-04 DIAGNOSIS — Z1231 Encounter for screening mammogram for malignant neoplasm of breast: Secondary | ICD-10-CM

## 2018-06-23 DIAGNOSIS — Z23 Encounter for immunization: Secondary | ICD-10-CM | POA: Diagnosis not present

## 2018-07-14 ENCOUNTER — Ambulatory Visit (HOSPITAL_COMMUNITY): Payer: Medicare Other

## 2018-07-16 ENCOUNTER — Ambulatory Visit (HOSPITAL_COMMUNITY)
Admission: RE | Admit: 2018-07-16 | Discharge: 2018-07-16 | Disposition: A | Discharge status: Home / Self Care | Payer: Medicare Other | Source: Ambulatory Visit | Attending: Nurse Practitioner | Admitting: Nurse Practitioner

## 2018-07-16 ENCOUNTER — Encounter (HOSPITAL_COMMUNITY): Payer: Self-pay

## 2018-07-16 DIAGNOSIS — Z1231 Encounter for screening mammogram for malignant neoplasm of breast: Secondary | ICD-10-CM | POA: Insufficient documentation

## 2018-08-08 DIAGNOSIS — Z1331 Encounter for screening for depression: Secondary | ICD-10-CM | POA: Diagnosis not present

## 2018-08-08 DIAGNOSIS — Z299 Encounter for prophylactic measures, unspecified: Secondary | ICD-10-CM | POA: Diagnosis not present

## 2018-08-08 DIAGNOSIS — E039 Hypothyroidism, unspecified: Secondary | ICD-10-CM | POA: Diagnosis not present

## 2018-08-08 DIAGNOSIS — Z1339 Encounter for screening examination for other mental health and behavioral disorders: Secondary | ICD-10-CM | POA: Diagnosis not present

## 2018-08-08 DIAGNOSIS — Z0000 Encounter for general adult medical examination without abnormal findings: Secondary | ICD-10-CM | POA: Diagnosis not present

## 2018-08-08 DIAGNOSIS — E785 Hyperlipidemia, unspecified: Secondary | ICD-10-CM | POA: Diagnosis not present

## 2018-08-08 DIAGNOSIS — Z6829 Body mass index (BMI) 29.0-29.9, adult: Secondary | ICD-10-CM | POA: Diagnosis not present

## 2018-08-08 DIAGNOSIS — I10 Essential (primary) hypertension: Secondary | ICD-10-CM | POA: Diagnosis not present

## 2018-08-08 DIAGNOSIS — Z79899 Other long term (current) drug therapy: Secondary | ICD-10-CM | POA: Diagnosis not present

## 2018-08-08 DIAGNOSIS — Z1211 Encounter for screening for malignant neoplasm of colon: Secondary | ICD-10-CM | POA: Diagnosis not present

## 2018-08-08 DIAGNOSIS — Z7189 Other specified counseling: Secondary | ICD-10-CM | POA: Diagnosis not present

## 2018-08-12 DIAGNOSIS — E2839 Other primary ovarian failure: Secondary | ICD-10-CM | POA: Diagnosis not present

## 2019-02-12 DIAGNOSIS — E039 Hypothyroidism, unspecified: Secondary | ICD-10-CM | POA: Diagnosis not present

## 2019-02-12 DIAGNOSIS — Z299 Encounter for prophylactic measures, unspecified: Secondary | ICD-10-CM | POA: Diagnosis not present

## 2019-02-12 DIAGNOSIS — I10 Essential (primary) hypertension: Secondary | ICD-10-CM | POA: Diagnosis not present

## 2019-02-12 DIAGNOSIS — Z6831 Body mass index (BMI) 31.0-31.9, adult: Secondary | ICD-10-CM | POA: Diagnosis not present

## 2019-02-12 DIAGNOSIS — Z713 Dietary counseling and surveillance: Secondary | ICD-10-CM | POA: Diagnosis not present

## 2019-04-27 DIAGNOSIS — Z23 Encounter for immunization: Secondary | ICD-10-CM | POA: Diagnosis not present

## 2019-06-10 ENCOUNTER — Other Ambulatory Visit (HOSPITAL_COMMUNITY): Payer: Self-pay | Admitting: Nurse Practitioner

## 2019-06-10 DIAGNOSIS — Z1231 Encounter for screening mammogram for malignant neoplasm of breast: Secondary | ICD-10-CM

## 2019-07-20 ENCOUNTER — Other Ambulatory Visit: Payer: Self-pay

## 2019-07-20 ENCOUNTER — Ambulatory Visit (HOSPITAL_COMMUNITY)
Admission: RE | Admit: 2019-07-20 | Discharge: 2019-07-20 | Disposition: A | Discharge status: Home / Self Care | Payer: Medicare Other | Source: Ambulatory Visit | Attending: Nurse Practitioner | Admitting: Nurse Practitioner

## 2019-07-20 DIAGNOSIS — Z1231 Encounter for screening mammogram for malignant neoplasm of breast: Secondary | ICD-10-CM | POA: Diagnosis not present

## 2019-08-24 DIAGNOSIS — Z23 Encounter for immunization: Secondary | ICD-10-CM | POA: Diagnosis not present

## 2019-09-23 DIAGNOSIS — Z23 Encounter for immunization: Secondary | ICD-10-CM | POA: Diagnosis not present

## 2019-09-25 DIAGNOSIS — Z79899 Other long term (current) drug therapy: Secondary | ICD-10-CM | POA: Diagnosis not present

## 2019-09-25 DIAGNOSIS — Z1331 Encounter for screening for depression: Secondary | ICD-10-CM | POA: Diagnosis not present

## 2019-09-25 DIAGNOSIS — Z0000 Encounter for general adult medical examination without abnormal findings: Secondary | ICD-10-CM | POA: Diagnosis not present

## 2019-09-25 DIAGNOSIS — Z6829 Body mass index (BMI) 29.0-29.9, adult: Secondary | ICD-10-CM | POA: Diagnosis not present

## 2019-09-25 DIAGNOSIS — I10 Essential (primary) hypertension: Secondary | ICD-10-CM | POA: Diagnosis not present

## 2019-09-25 DIAGNOSIS — Z1211 Encounter for screening for malignant neoplasm of colon: Secondary | ICD-10-CM | POA: Diagnosis not present

## 2019-09-25 DIAGNOSIS — Z299 Encounter for prophylactic measures, unspecified: Secondary | ICD-10-CM | POA: Diagnosis not present

## 2019-09-25 DIAGNOSIS — E039 Hypothyroidism, unspecified: Secondary | ICD-10-CM | POA: Diagnosis not present

## 2019-09-25 DIAGNOSIS — N390 Urinary tract infection, site not specified: Secondary | ICD-10-CM | POA: Diagnosis not present

## 2019-09-25 DIAGNOSIS — E785 Hyperlipidemia, unspecified: Secondary | ICD-10-CM | POA: Diagnosis not present

## 2019-09-25 DIAGNOSIS — Z1339 Encounter for screening examination for other mental health and behavioral disorders: Secondary | ICD-10-CM | POA: Diagnosis not present

## 2019-09-25 DIAGNOSIS — Z7189 Other specified counseling: Secondary | ICD-10-CM | POA: Diagnosis not present

## 2020-03-25 DIAGNOSIS — I10 Essential (primary) hypertension: Secondary | ICD-10-CM | POA: Diagnosis not present

## 2020-03-25 DIAGNOSIS — E039 Hypothyroidism, unspecified: Secondary | ICD-10-CM | POA: Diagnosis not present

## 2020-03-25 DIAGNOSIS — E7800 Pure hypercholesterolemia, unspecified: Secondary | ICD-10-CM | POA: Diagnosis not present

## 2020-03-25 DIAGNOSIS — R300 Dysuria: Secondary | ICD-10-CM | POA: Diagnosis not present

## 2020-03-25 DIAGNOSIS — Z299 Encounter for prophylactic measures, unspecified: Secondary | ICD-10-CM | POA: Diagnosis not present

## 2020-03-25 DIAGNOSIS — N390 Urinary tract infection, site not specified: Secondary | ICD-10-CM | POA: Diagnosis not present

## 2020-05-08 DIAGNOSIS — Z23 Encounter for immunization: Secondary | ICD-10-CM | POA: Diagnosis not present

## 2020-06-29 ENCOUNTER — Other Ambulatory Visit (HOSPITAL_COMMUNITY): Payer: Self-pay | Admitting: Internal Medicine

## 2020-06-29 DIAGNOSIS — Z1231 Encounter for screening mammogram for malignant neoplasm of breast: Secondary | ICD-10-CM

## 2020-08-05 ENCOUNTER — Ambulatory Visit (HOSPITAL_COMMUNITY): Payer: Medicare Other

## 2020-08-18 ENCOUNTER — Ambulatory Visit (HOSPITAL_COMMUNITY): Payer: Medicare Other

## 2020-09-07 ENCOUNTER — Ambulatory Visit (HOSPITAL_COMMUNITY)
Admission: RE | Admit: 2020-09-07 | Discharge: 2020-09-07 | Disposition: A | Discharge status: Home / Self Care | Payer: Medicare Other | Source: Ambulatory Visit | Attending: Internal Medicine | Admitting: Internal Medicine

## 2020-09-07 ENCOUNTER — Other Ambulatory Visit: Payer: Self-pay

## 2020-09-07 DIAGNOSIS — Z1231 Encounter for screening mammogram for malignant neoplasm of breast: Secondary | ICD-10-CM | POA: Diagnosis not present

## 2020-09-19 DIAGNOSIS — N390 Urinary tract infection, site not specified: Secondary | ICD-10-CM | POA: Diagnosis not present

## 2020-09-19 DIAGNOSIS — E038 Other specified hypothyroidism: Secondary | ICD-10-CM | POA: Diagnosis not present

## 2020-09-19 DIAGNOSIS — Z87891 Personal history of nicotine dependence: Secondary | ICD-10-CM | POA: Diagnosis not present

## 2020-09-19 DIAGNOSIS — Z6828 Body mass index (BMI) 28.0-28.9, adult: Secondary | ICD-10-CM | POA: Diagnosis not present

## 2020-09-19 DIAGNOSIS — R350 Frequency of micturition: Secondary | ICD-10-CM | POA: Diagnosis not present

## 2020-09-19 DIAGNOSIS — Z299 Encounter for prophylactic measures, unspecified: Secondary | ICD-10-CM | POA: Diagnosis not present

## 2020-09-28 DIAGNOSIS — E039 Hypothyroidism, unspecified: Secondary | ICD-10-CM | POA: Diagnosis not present

## 2020-09-28 DIAGNOSIS — Z7189 Other specified counseling: Secondary | ICD-10-CM | POA: Diagnosis not present

## 2020-09-28 DIAGNOSIS — R350 Frequency of micturition: Secondary | ICD-10-CM | POA: Diagnosis not present

## 2020-09-28 DIAGNOSIS — Z79899 Other long term (current) drug therapy: Secondary | ICD-10-CM | POA: Diagnosis not present

## 2020-09-28 DIAGNOSIS — Z1331 Encounter for screening for depression: Secondary | ICD-10-CM | POA: Diagnosis not present

## 2020-09-28 DIAGNOSIS — E785 Hyperlipidemia, unspecified: Secondary | ICD-10-CM | POA: Diagnosis not present

## 2020-09-28 DIAGNOSIS — Z0000 Encounter for general adult medical examination without abnormal findings: Secondary | ICD-10-CM | POA: Diagnosis not present

## 2020-09-28 DIAGNOSIS — R5383 Other fatigue: Secondary | ICD-10-CM | POA: Diagnosis not present

## 2020-09-28 DIAGNOSIS — Z1339 Encounter for screening examination for other mental health and behavioral disorders: Secondary | ICD-10-CM | POA: Diagnosis not present

## 2020-09-28 DIAGNOSIS — Z6828 Body mass index (BMI) 28.0-28.9, adult: Secondary | ICD-10-CM | POA: Diagnosis not present

## 2020-09-28 DIAGNOSIS — R030 Elevated blood-pressure reading, without diagnosis of hypertension: Secondary | ICD-10-CM | POA: Diagnosis not present

## 2020-10-11 DIAGNOSIS — E2839 Other primary ovarian failure: Secondary | ICD-10-CM | POA: Diagnosis not present

## 2021-01-23 ENCOUNTER — Other Ambulatory Visit: Payer: Self-pay

## 2021-01-23 ENCOUNTER — Ambulatory Visit: Payer: Medicare Other

## 2021-01-23 ENCOUNTER — Ambulatory Visit (INDEPENDENT_AMBULATORY_CARE_PROVIDER_SITE_OTHER): Payer: Medicare Other | Admitting: Orthopedic Surgery

## 2021-01-23 VITALS — BP 140/59 | HR 72 | Ht 64.0 in | Wt 152.0 lb

## 2021-01-23 DIAGNOSIS — M1711 Unilateral primary osteoarthritis, right knee: Secondary | ICD-10-CM

## 2021-01-23 DIAGNOSIS — M25561 Pain in right knee: Secondary | ICD-10-CM

## 2021-01-23 NOTE — Progress Notes (Signed)
NEW PROBLEM//OFFICE VISIT  Summary assessment and plan:   80 year old female with osteoarthritis of the right knee, a valgus knee which would require replacement to get her back to normal function.  She also has a severe scoliosis and possible spinal stenosis  She will talk to her family about possible knee replacement surgery  Chief Complaint  Patient presents with   Knee Pain    R/ it has been hurting for several months. Don't remember doing anything to cause it to bother me. I have fell a couple of times in the last 3 years and it hurt but it got better with time.   80 year old female with hypertension and hypothyroidism presents with long history of intermittent pain in her right knee progressively worsening over the last 2 months associated with multiple minor traumas.  She notices she is having more difficulty ambulating and painful weightbearing over the lateral compartment and she feels like her kneecap may have moved out of place  She has only had Tylenol arthritis and topical medications without good pain relief    MEDICAL DECISION MAKING  A.  Encounter Diagnosis  Name Primary?   Acute pain of right knee Yes    B. DATA ANALYSED:   IMAGING: Interpretation of images: X-rays that were done in the office today show a valgus deformity of about 10 degrees she has multiple areas of loose bodies throughout the joint and degenerative narrowing of the lateral compartment with multiple areas of secondary bone changes  Orders: None  Outside records reviewed: None   C. MANAGEMENT   Recommend right total knee.  There is some concern about the level of lumbar pelvic flexion that she has an back as she walks very bumped and bent over  No orders of the defined types were placed in this encounter.    BP (!) 140/59   Pulse 72   Ht 5\' 4"  (1.626 m)   Wt 152 lb (68.9 kg)   BMI 26.09 kg/m    General appearance: Well-developed well-nourished no gross  deformities  Cardiovascular normal pulse and perfusion normal color without edema  Neurologically no sensation loss or deficits or pathologic reflexes  Psychological: Awake alert and oriented x3 mood and affect normal  Skin no lacerations or ulcerations no nodularity no palpable masses, no erythema or nodularity  Musculoskeletal:  Valgus alignment to the right knee Lateral joint line tenderness The patella seems to be subluxated Her flexion forced is 110 she has a slight flexion contracture but is less than 5 degrees She has no pseudolaxity or regular laxity knee is otherwise stable   Review of Systems  Constitutional:  Negative for fever.  HENT:  Negative for congestion and hearing loss.   Respiratory:  Negative for shortness of breath.   Cardiovascular:  Negative for chest pain.  Gastrointestinal:  Negative for heartburn.  Musculoskeletal:  Positive for back pain and myalgias.  Skin:  Negative for rash.  All other systems reviewed and are negative.   Past Medical History:  Diagnosis Date   Essential hypertension, benign 04/10/2017   High cholesterol 04/10/2017   Hypothyroidism 04/10/2017    Past Surgical History:  Procedure Laterality Date   CATARACT EXTRACTION     bilateral   COLONOSCOPY N/A 04/18/2017   Procedure: COLONOSCOPY;  Surgeon: Rogene Houston, MD;  Location: [facility];  Service: Endoscopy;  Laterality: N/A;  10:30   POLYPECTOMY  04/18/2017   Procedure: POLYPECTOMY;  Surgeon: Rogene Houston, MD;  Location: [facility];  Service: Endoscopy;;    No family history on file. Social History   Tobacco Use   Smoking status: Never   Smokeless tobacco: Never  Substance Use Topics   Alcohol use: No   Drug use: No    No Known Allergies  Current Meds  Medication Sig   Ascorbic Acid (VITAMIN C) 1000 MG tablet Take 1,000 mg by mouth daily.   aspirin 81 MG tablet Take 1 tablet (81 mg total) by mouth daily.   b complex vitamins tablet Take 1 tablet by  mouth daily.   Biotin 10 MG CAPS Take 10 mg by mouth daily.    Calcium Carbonate-Vit D-Min (CALCIUM 1200) 1200-1000 MG-UNIT CHEW Chew 2 mg by mouth every morning.    Coenzyme Q10 (COQ10) 100 MG CAPS Take 100 mg by mouth daily.   Cyanocobalamin (VITAMIN B12) 1000 MCG TBCR Take 5,000 mcg by mouth daily.   levothyroxine (SYNTHROID, LEVOTHROID) 50 MCG tablet Take 25-50 mcg by mouth every other day. 80mcg, 33mcg,25mcg....   lisinopril (PRINIVIL,ZESTRIL) 20 MG tablet Take 20 mg by mouth daily.   lovastatin (MEVACOR) 20 MG tablet Take 20 mg by mouth at bedtime.   Multiple Vitamin (MULTIVITAMIN) tablet Take 1 tablet by mouth daily.   Omega-3 Fatty Acids (FISH OIL) 1200 MG CAPS Take 2,400 mg by mouth 2 (two) times daily. With Vit D3   senna (SENOKOT) 8.6 MG tablet Take 2 tablets by mouth at bedtime.    vitamin E 400 UNIT capsule Take 400 Units by mouth daily.        Arther Abbott, MD  01/23/2021 9:48 AM

## 2021-01-24 ENCOUNTER — Telehealth: Payer: Self-pay | Admitting: Orthopedic Surgery

## 2021-01-24 NOTE — Telephone Encounter (Signed)
Ms. Edison called in this morning stating that she wants to go ahead and schedule surgery for July 15th.  Please call her if there are any problems and for further appointments  Thanks

## 2021-01-24 NOTE — Telephone Encounter (Signed)
She wants to proceed with total knee replacement Ok to schedule? Will have to discuss date with her, the date she is asking for is a Friday, she will need a Tuesday.

## 2021-01-26 ENCOUNTER — Other Ambulatory Visit: Payer: Self-pay | Admitting: Orthopedic Surgery

## 2021-01-26 MED ORDER — BUPIVACAINE-MELOXICAM ER 200-6 MG/7ML IJ SOLN: 400.0000 mg | Freq: Once | INTRAMUSCULAR | Status: AC

## 2021-01-26 NOTE — Telephone Encounter (Signed)
Patient has spoken with her family and she is now wanting to do her surgery on July 26th   Please call her back.

## 2021-02-07 ENCOUNTER — Other Ambulatory Visit: Payer: Self-pay | Admitting: Orthopedic Surgery

## 2021-02-07 DIAGNOSIS — M1711 Unilateral primary osteoarthritis, right knee: Secondary | ICD-10-CM

## 2021-02-16 NOTE — Patient Instructions (Signed)
April Duran  02/16/2021     @PREFPERIOPPHARMACY @   Your procedure is scheduled on  02/21/2021.   Report to [facility] at  [phone]  A.M.   Call this number if you have problems the morning of surgery:  [phone]   Remember:  Do not eat or drink after midnight.      Take these medicines the morning of surgery with A SIP OF WATER        levothyroxine.     Do not wear jewelry, make-up or nail polish.  Do not wear lotions, powders, or perfumes, or deodorant.  Do not shave 48 hours prior to surgery.  Men may shave face and neck.  Do not bring valuables to the hospital.  [facility] is not responsible for any belongings or valuables.  Contacts, dentures or bridgework may not be worn into surgery.  Leave your suitcase in the car.  After surgery it may be brought to your room.  For patients admitted to the hospital, discharge time will be determined by your treatment team.  Patients discharged the day of surgery will not be allowed to drive home.    Special instructions:    DO NOT smoke tobacco or vape for 24 hours before your procedure.  Please read over the following fact sheets that you were given. Anesthesia Post-op Instructions and Care and Recovery After Surgery      Total Knee Replacement, Care After This sheet gives you information about how to care for yourself after your procedure. Your health care provider may also give you more specific instructions. If you have problems or questions, contact your health careprovider. What can I expect after the procedure? After the procedure, it is common to have: Redness, pain, and swelling at the incision area. Stiffness. Discomfort. A small amount of blood or clear fluid coming from your incision. Follow these instructions at home: Medicines Take over-the-counter and prescription medicines only as told by your health care provider. If you were prescribed a blood thinner (anticoagulant), take it as told by  your health care provider. Ask your health care provider if the medicine prescribed to you: Requires you to avoid driving or using machinery. Can cause constipation. You may need to take these actions to prevent or treat constipation: Drink enough fluid to keep your urine pale yellow. Take over-the-counter or prescription medicines. Eat foods that are high in fiber, such as beans, whole grains, and fresh fruits and vegetables. Limit foods that are high in fat and processed sugars, such as fried or sweet foods. Incision care  Follow instructions from your health care provider about how to take care of your incision. Make sure you: Wash your hands with soap and water for at least 20 seconds before and after you change your bandage (dressing). If soap and water are not available, use hand sanitizer. Change your dressing as told by your health care provider. Leave stitches (sutures), staples, skin glue, or adhesive strips in place. These skin closures may need to stay in place for 2 weeks or longer. If adhesive strip edges start to loosen and curl up, you may trim the loose edges. Do not remove adhesive strips completely unless your health care provider tells you to do that. Do not take baths, swim, or use a hot tub until your health care provider approves. Check your incision area every day for signs of infection. Check for: More redness, swelling, or pain. More fluid  or blood. Warmth. Pus or a bad smell.  Activity Rest as told by your health care provider. Avoid sitting for a long time without moving. Get up to take short walks every 1-2 hours. This is important to improve blood flow and breathing. Ask for help if you feel weak or unsteady. Follow instructions from your health care provider about using a walker, crutches, or a cane. You may use your legs to support (bear) your body weight as told by your health care provider. Follow instructions about how much weight you may safely support on  your affected leg (weight-bearing restrictions). A physical therapist may show you how to get out of a bed and chair and how to go up and down stairs. You will first do this with a walker, crutches, or a cane and then without any of these devices. Once you are able to walk without a limp, you may stop using a walker, crutches, or a cane. Do exercises as told by your health care provider or physical therapist. Avoid high-impact activities, including running, jumping rope, and doing jumping jacks. Do not play contact sports until your health care provider approves. Return to your normal activities as told by your health care provider. Ask your health care provider what activities are safe for you. Managing pain, stiffness, and swelling  If directed, put ice on your knee. To do this: Put ice in a plastic bag or use the icing device (cold flow pad) that you were given. Follow instructions from your health care provider about how to use the icing device. Place a towel between your skin and the bag or between your skin and the icing device. Leave the ice on for 20 minutes, 2-3 times a day. Remove the ice if your skin turns bright red. This is very important. If you cannot feel pain, heat, or cold, you have a greater risk of damage to the area. Move your toes often to reduce stiffness and swelling. Raise (elevate) your leg above the level of your heart while you are sitting or lying down. Use several pillows to keep your leg straight. Do not put a pillow just under the knee. If the knee is bent for a long time, this may lead to stiffness. Wear elastic knee support as told by your health care provider.  Safety  To help prevent falls, keep floors clear of objects you may trip over. Place items that you may need within easy reach. Wear an apron or tool belt with pockets for carrying objects. This leaves your hands free to help with your balance. Ask your health care provider when it is safe to  drive.  General instructions Wear compression stockings as told by your health care provider. These stockings help to prevent blood clots and reduce swelling in your legs. Continue with breathing exercises. This helps prevent lung infection. Do not use any products that contain nicotine or tobacco. These products include cigarettes, chewing tobacco, and vaping devices, such as e-cigarettes. These can delay healing after surgery. If you need help quitting, ask your health care provider. Tell your health care provider if you plan to have dental work. Also: Tell your dentist about your joint replacement. Ask your health care provider if there are any special instructions you need to follow before having dental care and routine cleanings. Keep all follow-up visits. This is important. Contact a health care provider if: You have a fever or chills. You have a cough or feel short of breath. Your medicine is  not controlling your pain. You have any of these signs of infection: More redness, swelling, or pain around your incision. More fluid or blood coming from your incision. Warmth coming from your incision. Pus or a bad smell coming from your incision. You fall. Get help right away if: You have severe pain. You have trouble breathing. You have chest pain. You have redness, swelling, pain, or warmth in your calf or leg. Your incision breaks open after sutures or staples are removed. These symptoms may represent a serious problem that is an emergency. Do not wait to see if the symptoms will go away. Get medical help right away. Call your local emergency services (911 in the [place] Do not drive yourself to the hospital. Summary After the procedure, it is common to have pain and swelling at the incision area, a small amount of blood or fluid coming from your incision, and stiffness. Follow instructions from your health care provider about how to take care of your incision. Use crutches, a walker,  or a cane as told by your health care provider. This information is not intended to replace advice given to you by your health care provider. Make sure you discuss any questions you have with your healthcare provider. Document Revised: 01/05/2020 Document Reviewed: 01/05/2020 Elsevier Patient Education  [place] Anesthesia, Adult, Care After This sheet gives you information about how to care for yourself after your procedure. Your health care provider may also give you more specific instructions. If you have problems or questions, contact your health careprovider. What can I expect after the procedure? After the procedure, the following side effects are common: Pain or discomfort at the IV site. Nausea. Vomiting. Sore throat. Trouble concentrating. Feeling cold or chills. Feeling weak or tired. Sleepiness and fatigue. Soreness and body aches. These side effects can affect parts of the body that were not involved in surgery. Follow these instructions at home: For the time period you were told by your health care provider:  Rest. Do not participate in activities where you could fall or become injured. Do not drive or use machinery. Do not drink alcohol. Do not take sleeping pills or medicines that cause drowsiness. Do not make important decisions or sign legal documents. Do not take care of children on your own.  Eating and drinking Follow any instructions from your health care provider about eating or drinking restrictions. When you feel hungry, start by eating small amounts of foods that are soft and easy to digest (bland), such as toast. Gradually return to your regular diet. Drink enough fluid to keep your urine pale yellow. If you vomit, rehydrate by drinking water, juice, or clear broth. General instructions If you have sleep apnea, surgery and certain medicines can increase your risk for breathing problems. Follow instructions from your health care provider  about wearing your sleep device: Anytime you are sleeping, including during daytime naps. While taking prescription pain medicines, sleeping medicines, or medicines that make you drowsy. Have a responsible adult stay with you for the time you are told. It is important to have someone help care for you until you are awake and alert. Return to your normal activities as told by your health care provider. Ask your health care provider what activities are safe for you. Take over-the-counter and prescription medicines only as told by your health care provider. If you smoke, do not smoke without supervision. Keep all follow-up visits as told by your health care provider. This is important. Contact a  health care provider if: You have nausea or vomiting that does not get better with medicine. You cannot eat or drink without vomiting. You have pain that does not get better with medicine. You are unable to pass urine. You develop a skin rash. You have a fever. You have redness around your IV site that gets worse. Get help right away if: You have difficulty breathing. You have chest pain. You have blood in your urine or stool, or you vomit blood. Summary After the procedure, it is common to have a sore throat or nausea. It is also common to feel tired. Have a responsible adult stay with you for the time you are told. It is important to have someone help care for you until you are awake and alert. When you feel hungry, start by eating small amounts of foods that are soft and easy to digest (bland), such as toast. Gradually return to your regular diet. Drink enough fluid to keep your urine pale yellow. Return to your normal activities as told by your health care provider. Ask your health care provider what activities are safe for you. This information is not intended to replace advice given to you by your health care provider. Make sure you discuss any questions you have with your healthcare  provider. Document Revised: 03/31/2020 Document Reviewed: 10/29/2019 Elsevier Patient Education  [place]. Spinal Anesthesia and Epidural Anesthesia, Care After This sheet gives you information about how to care for yourself after your procedure. Your doctor may also give you more specific instructions. If youhave problems or questions, contact your doctor. What can I expect after the procedure? While the medicines you were given are still having effects, it is common to: Feel like you may vomit (nauseous). Vomit. Have a numb feeling or tingling in your legs. Have problems when you pee (urinate). Feel itchy. Follow these instructions at home: For the time period you were told by your doctor:  Rest. Do not do activities where you could fall or get hurt. Do not drive or use machines. Do not drink alcohol. Do not take sleeping pills or medicines that make you drowsy. Do not make big decisions or sign legal documents. Do not take care of children on your own.  Eating and drinking If you vomit, wait a short time. When you can drink without vomiting, try to drink some water, juice, or clear soup. Drink enough fluid to keep your pee (urine) pale yellow. Make sure you do not feel like vomiting before you eat solid foods. Follow the diet that your doctor recommends. General instructions If you have sleep apnea, surgery and certain medicines can raise your risk for breathing problems. Follow instructions from your doctor about when to wear your sleep device. Have a responsible adult stay with you for the time you are told. It is important to have someone help care for you until you are awake and alert. Return to your normal activities as told by your doctor. Ask your doctor what activities are safe for you. Take over-the-counter and prescription medicines only as told by your doctor. Do not use any products that contain nicotine or tobacco, such as cigarettes, e-cigarettes, and  chewing tobacco. If you need help quitting, ask your doctor. Keep all follow-up visits as told by your doctor. This is important. Contact a doctor if: It has been more than one day since your procedure and you feel like you may vomit or you are vomiting. You have a rash. Get help right  away if: You have a fever. You have a headache that lasts a long time. You have a very bad headache. Your vision is blurry or you see two of a single object (double vision). You are dizzy or light-headed. You faint. Your arms or legs tingle, feel weak, or get numb. You have trouble breathing. You cannot pee. These symptoms may be an emergency. Get help right away. Call your local emergency services (911 in the [place] Do not wait to see if the symptoms will go away. Do not drive yourself to the hospital. Summary After the procedure, have a responsible adult stay with you at home. Do not do activities that might cause you to get hurt. Do not drive, use machinery, drink alcohol, or make important decisions as told by your doctor. Do not use products that contain nicotine or tobacco. Get help right away if you have a very bad headache, trouble breathing, or you cannot pee. This information is not intended to replace advice given to you by your health care provider. Make sure you discuss any questions you have with your healthcare provider. Document Revised: 03/31/2020 Document Reviewed: 09/03/2019 Elsevier Patient Education  2022 [place]. How to Use Chlorhexidine for Bathing Chlorhexidine gluconate (CHG) is a germ-killing (antiseptic) solution that is used to clean the skin. It can get rid of the bacteria that normally live on the skin and can keep them away for about 24 hours. To clean your skin with CHG, you may be given: A CHG solution to use in the shower or as part of a sponge bath. A prepackaged cloth that contains CHG. Cleaning your skin with CHG may help lower the risk for infection: While you  are staying in the intensive care unit of the hospital. If you have a vascular access, such as a central line, to provide short-term or long-term access to your veins. If you have a catheter to drain urine from your bladder. If you are on a ventilator. A ventilator is a machine that helps you breathe by moving air in and out of your lungs. After surgery. What are the risks? Risks of using CHG include: A skin reaction. Hearing loss, if CHG gets in your ears. Eye injury, if CHG gets in your eyes and is not rinsed out. The CHG product catching fire. Make sure that you avoid smoking and flames after applying CHG to your skin. Do not use CHG: If you have a chlorhexidine allergy or have previously reacted to chlorhexidine. On babies younger than 87 months of age. How to use CHG solution Use CHG only as told by your health care provider, and follow the instructions on the label. Use the full amount of CHG as directed. Usually, this is one bottle. During a shower Follow these steps when using CHG solution during a shower (unless your health care provider gives you different instructions): Start the shower. Use your normal soap and shampoo to wash your face and hair. Turn off the shower or move out of the shower stream. Pour the CHG onto a clean washcloth. Do not use any type of brush or rough-edged sponge. Starting at your neck, lather your body down to your toes. Make sure you follow these instructions: If you will be having surgery, pay special attention to the part of your body where you will be having surgery. Scrub this area for at least 1 minute. Do not use CHG on your head or face. If the solution gets into your ears or eyes, rinse  them well with water. Avoid your genital area. Avoid any areas of skin that have broken skin, cuts, or scrapes. Scrub your back and under your arms. Make sure to wash skin folds. Let the lather sit on your skin for 1-2 minutes or as long as told by your health  care provider. Thoroughly rinse your entire body in the shower. Make sure that all body creases and crevices are rinsed well. Dry off with a clean towel. Do not put any substances on your body afterward--such as powder, lotion, or perfume--unless you are told to do so by your health care provider. Only use lotions that are recommended by the manufacturer. Put on clean clothes or pajamas. If it is the night before your surgery, sleep in clean sheets.  During a sponge bath Follow these steps when using CHG solution during a sponge bath (unless your health care provider gives you different instructions): Use your normal soap and shampoo to wash your face and hair. Pour the CHG onto a clean washcloth. Starting at your neck, lather your body down to your toes. Make sure you follow these instructions: If you will be having surgery, pay special attention to the part of your body where you will be having surgery. Scrub this area for at least 1 minute. Do not use CHG on your head or face. If the solution gets into your ears or eyes, rinse them well with water. Avoid your genital area. Avoid any areas of skin that have broken skin, cuts, or scrapes. Scrub your back and under your arms. Make sure to wash skin folds. Let the lather sit on your skin for 1-2 minutes or as long as told by your health care provider. Using a different clean, wet washcloth, thoroughly rinse your entire body. Make sure that all body creases and crevices are rinsed well. Dry off with a clean towel. Do not put any substances on your body afterward--such as powder, lotion, or perfume--unless you are told to do so by your health care provider. Only use lotions that are recommended by the manufacturer. Put on clean clothes or pajamas. If it is the night before your surgery, sleep in clean sheets. How to use CHG prepackaged cloths Only use CHG cloths as told by your health care provider, and follow the instructions on the label. Use  the CHG cloth on clean, dry skin. Do not use the CHG cloth on your head or face unless your health care provider tells you to. When washing with the CHG cloth: Avoid your genital area. Avoid any areas of skin that have broken skin, cuts, or scrapes. Before surgery Follow these steps when using a CHG cloth to clean before surgery (unless your health care provider gives you different instructions): Using the CHG cloth, vigorously scrub the part of your body where you will be having surgery. Scrub using a back-and-forth motion for 3 minutes. The area on your body should be completely wet with CHG when you are done scrubbing. Do not rinse. Discard the cloth and let the area air-dry. Do not put any substances on the area afterward, such as powder, lotion, or perfume. Put on clean clothes or pajamas. If it is the night before your surgery, sleep in clean sheets.  For general bathing Follow these steps when using CHG cloths for general bathing (unless your health care provider gives you different instructions). Use a separate CHG cloth for each area of your body. Make sure you wash between any folds of skin and between  your fingers and toes. Wash your body in the following order, switching to a new cloth after each step: The front of your neck, shoulders, and chest. Both of your arms, under your arms, and your hands. Your stomach and groin area, avoiding the genitals. Your right leg and foot. Your left leg and foot. The back of your neck, your back, and your buttocks. Do not rinse. Discard the cloth and let the area air-dry. Do not put any substances on your body afterward--such as powder, lotion, or perfume--unless you are told to do so by your health care provider. Only use lotions that are recommended by the manufacturer. Put on clean clothes or pajamas. Contact a health care provider if: Your skin gets irritated after scrubbing. You have questions about using your solution or cloth. Get help  right away if: Your eyes become very red or swollen. Your eyes itch badly. Your skin itches badly and is red or swollen. Your hearing changes. You have trouble seeing. You have swelling or tingling in your mouth or throat. You have trouble breathing. You swallow any chlorhexidine. Summary Chlorhexidine gluconate (CHG) is a germ-killing (antiseptic) solution that is used to clean the skin. Cleaning your skin with CHG may help to lower your risk for infection. You may be given CHG to use for bathing. It may be in a bottle or in a prepackaged cloth to use on your skin. Carefully follow your health care provider's instructions and the instructions on the product label. Do not use CHG if you have a chlorhexidine allergy. Contact your health care provider if your skin gets irritated after scrubbing. This information is not intended to replace advice given to you by your health care provider. Make sure you discuss any questions you have with your healthcare provider. Document Revised: 11/27/2019 Document Reviewed: 01/01/2020 Elsevier Patient Education  [place].

## 2021-02-17 ENCOUNTER — Encounter (HOSPITAL_COMMUNITY): Payer: Self-pay

## 2021-02-17 ENCOUNTER — Other Ambulatory Visit (HOSPITAL_COMMUNITY)
Admission: RE | Admit: 2021-02-17 | Discharge: 2021-02-17 | Disposition: A | Discharge status: Home / Self Care | Payer: Medicare Other | Source: Ambulatory Visit | Attending: Orthopedic Surgery | Admitting: Orthopedic Surgery

## 2021-02-17 ENCOUNTER — Encounter (HOSPITAL_COMMUNITY)
Admission: RE | Admit: 2021-02-17 | Discharge: 2021-02-17 | Disposition: A | Discharge status: Home / Self Care | Payer: Medicare Other | Source: Ambulatory Visit | Attending: Orthopedic Surgery | Admitting: Orthopedic Surgery

## 2021-02-17 ENCOUNTER — Other Ambulatory Visit: Payer: Self-pay

## 2021-02-17 DIAGNOSIS — Z20822 Contact with and (suspected) exposure to covid-19: Secondary | ICD-10-CM | POA: Diagnosis not present

## 2021-02-17 DIAGNOSIS — Z01818 Encounter for other preprocedural examination: Secondary | ICD-10-CM | POA: Diagnosis not present

## 2021-02-17 LAB — BASIC METABOLIC PANEL
Anion gap: 8 (ref 5–15)
BUN: 21 mg/dL (ref 8–23)
CO2: 24 mmol/L (ref 22–32)
Calcium: 8.8 mg/dL — ABNORMAL LOW (ref 8.9–10.3)
Chloride: 99 mmol/L (ref 98–111)
Creatinine, Ser: 0.53 mg/dL (ref 0.44–1.00)
GFR, Estimated: >60 mL/min (ref >60)
Glucose, Bld: 100 mg/dL — ABNORMAL HIGH (ref 70–99)
Potassium: 4.1 mmol/L (ref 3.5–5.1)
Sodium: 131 mmol/L — ABNORMAL LOW (ref 135–145)

## 2021-02-17 LAB — CBC WITH DIFFERENTIAL/PLATELET
Abs Immature Granulocytes: 0.04 10*3/uL (ref 0.00–0.07)
Basophils Absolute: 0.1 10*3/uL (ref 0.0–0.1)
Basophils Relative: 0 %
Eosinophils Absolute: 0.1 10*3/uL (ref 0.0–0.5)
Eosinophils Relative: 1 %
HCT: 39.2 % (ref 36.0–46.0)
Hemoglobin: 13.2 g/dL (ref 12.0–15.0)
Immature Granulocytes: 0 %
Lymphocytes Relative: 44 %
Lymphs Abs: 6.3 10*3/uL — ABNORMAL HIGH (ref 0.7–4.0)
MCH: 32.6 pg (ref 26.0–34.0)
MCHC: 33.7 g/dL (ref 30.0–36.0)
MCV: 96.8 fL (ref 80.0–100.0)
Monocytes Absolute: 1.2 10*3/uL — ABNORMAL HIGH (ref 0.1–1.0)
Monocytes Relative: 8 %
Neutro Abs: 6.7 10*3/uL (ref 1.7–7.7)
Neutrophils Relative %: 47 %
Platelets: 286 10*3/uL (ref 150–400)
RBC: 4.05 MIL/uL (ref 3.87–5.11)
RDW: 12.4 % (ref 11.5–15.5)
WBC: 14.4 10*3/uL — ABNORMAL HIGH (ref 4.0–10.5)
nRBC: 0 % (ref 0.0–0.2)

## 2021-02-17 LAB — SARS CORONAVIRUS 2 (TAT 6-24 HRS): SARS Coronavirus 2: NEGATIVE

## 2021-02-17 LAB — PREPARE RBC (CROSSMATCH)

## 2021-02-20 NOTE — H&P (Signed)
Summary assessment and plan:    80 year old female with osteoarthritis of the right knee, a valgus knee which would require replacement to get her back to normal function.  She also has a severe scoliosis and possible spinal stenosis  She will talk to her family about possible knee replacement surgery  RIGHT TOTAL KNEE ARTHROPLASTY        Chief Complaint  Patient presents with   Knee Pain      R/ it has been hurting for several months. Don't remember doing anything to cause it to bother me. I have fell a couple of times in the last 3 years and it hurt but it got better with time.    80 year old female with hypertension and hypothyroidism presents with long history of intermittent pain in her right knee progressively worsening over the last 2 months associated with multiple minor traumas.  She notices she is having more difficulty ambulating and painful weightbearing over the lateral compartment and she feels like her kneecap may have moved out of place   She has only had Tylenol arthritis and topical medications without good pain relief       MEDICAL DECISION MAKING   A.      Encounter Diagnosis  Name Primary?   Acute pain of right knee Yes      B. DATA ANALYSED:   IMAGING: Interpretation of images: X-rays that were done in the office today show a valgus deformity of about 10 degrees she has multiple areas of loose bodies throughout the joint and degenerative narrowing of the lateral compartment with multiple areas of secondary bone changes  Orders: None  Outside records reviewed: None    C. MANAGEMENT    Recommend right total knee.  There is some concern about the level of lumbar pelvic flexion that she has an back as she walks very bumped and bent over   No orders of the defined types were placed in this encounter.       BP (!) 140/59   Pulse 72   Ht '5\' 4"'$  (1.626 m)   Wt 152 lb (68.9 kg)   BMI 26.09 kg/m      General appearance: Well-developed  well-nourished no gross deformities  Cardiovascular normal pulse and perfusion normal color without edema  Neurologically no sensation loss or deficits or pathologic reflexes   Psychological: Awake alert and oriented x3 mood and affect normal   Skin no lacerations or ulcerations no nodularity no palpable masses, no erythema or nodularity   Musculoskeletal:  Valgus alignment to the right knee Lateral joint line tenderness The patella seems to be subluxated Her flexion forced is 110 she has a slight flexion contracture but is less than 5 degrees She has no pseudolaxity or regular laxity knee is otherwise stable     Review of Systems Constitutional:  Negative for fever. HENT:  Negative for congestion and hearing loss.   Respiratory:  Negative for shortness of breath.   Cardiovascular:  Negative for chest pain. Gastrointestinal:  Negative for heartburn. Musculoskeletal:  Positive for back pain and myalgias. Skin:  Negative for rash.  All other systems reviewed and are negative.         Past Medical History:  Diagnosis Date   Essential hypertension, benign 04/10/2017   High cholesterol 04/10/2017   Hypothyroidism 04/10/2017           Past Surgical History:  Procedure Laterality Date   CATARACT EXTRACTION        bilateral   COLONOSCOPY  N/A 04/18/2017    Procedure: COLONOSCOPY;  Surgeon: Rogene Houston, MD;  Location: [facility];  Service: Endoscopy;  Laterality: N/A;  10:30   POLYPECTOMY   04/18/2017    Procedure: POLYPECTOMY;  Surgeon: Rogene Houston, MD;  Location: [facility];  Service: Endoscopy;;      No family history on file. Social History        Tobacco Use   Smoking status: Never   Smokeless tobacco: Never  Substance Use Topics   Alcohol use: No   Drug use: No      No Known Allergies       Current Meds  Medication Sig   Ascorbic Acid (VITAMIN C) 1000 MG tablet Take 1,000 mg by mouth daily.   aspirin 81 MG tablet Take 1 tablet (81 mg total) by  mouth daily.   b complex vitamins tablet Take 1 tablet by mouth daily.   Biotin 10 MG CAPS Take 10 mg by mouth daily.   Calcium Carbonate-Vit D-Min (CALCIUM 1200) 1200-1000 MG-UNIT CHEW Chew 2 mg by mouth every morning.   Coenzyme Q10 (COQ10) 100 MG CAPS Take 100 mg by mouth daily.   Cyanocobalamin (VITAMIN B12) 1000 MCG TBCR Take 5,000 mcg by mouth daily.   levothyroxine (SYNTHROID, LEVOTHROID) 50 MCG tablet Take 25-50 mcg by mouth every other day. 8mg, 52m,25mcg....   lisinopril (PRINIVIL,ZESTRIL) 20 MG tablet Take 20 mg by mouth daily.   lovastatin (MEVACOR) 20 MG tablet Take 20 mg by mouth at bedtime.   Multiple Vitamin (MULTIVITAMIN) tablet Take 1 tablet by mouth daily.   Omega-3 Fatty Acids (FISH OIL) 1200 MG CAPS Take 2,400 mg by mouth 2 (two) times daily. With Vit D3   senna (SENOKOT) 8.6 MG tablet Take 2 tablets by mouth at bedtime.   vitamin E 400 UNIT capsule Take 400 Units by mouth daily.

## 2021-02-21 ENCOUNTER — Observation Stay (HOSPITAL_COMMUNITY)
Admission: RE | Admit: 2021-02-21 | Discharge: 2021-02-22 | Disposition: A | Discharge status: Home / Self Care | Payer: Medicare Other | Attending: Orthopedic Surgery | Admitting: Orthopedic Surgery

## 2021-02-21 ENCOUNTER — Ambulatory Visit (HOSPITAL_COMMUNITY): Payer: Medicare Other | Admitting: Anesthesiology

## 2021-02-21 ENCOUNTER — Encounter (HOSPITAL_COMMUNITY): Payer: Self-pay | Admitting: Orthopedic Surgery

## 2021-02-21 ENCOUNTER — Encounter (HOSPITAL_COMMUNITY)
Admission: RE | Disposition: A | Discharge status: Home / Self Care | Payer: Self-pay | Source: Home / Self Care | Attending: Orthopedic Surgery

## 2021-02-21 ENCOUNTER — Ambulatory Visit (HOSPITAL_COMMUNITY): Payer: Medicare Other

## 2021-02-21 DIAGNOSIS — I10 Essential (primary) hypertension: Secondary | ICD-10-CM | POA: Insufficient documentation

## 2021-02-21 DIAGNOSIS — Z79899 Other long term (current) drug therapy: Secondary | ICD-10-CM | POA: Insufficient documentation

## 2021-02-21 DIAGNOSIS — Z7982 Long term (current) use of aspirin: Secondary | ICD-10-CM | POA: Insufficient documentation

## 2021-02-21 DIAGNOSIS — M1711 Unilateral primary osteoarthritis, right knee: Principal | ICD-10-CM | POA: Insufficient documentation

## 2021-02-21 DIAGNOSIS — E039 Hypothyroidism, unspecified: Secondary | ICD-10-CM | POA: Diagnosis not present

## 2021-02-21 DIAGNOSIS — Z471 Aftercare following joint replacement surgery: Secondary | ICD-10-CM | POA: Diagnosis not present

## 2021-02-21 DIAGNOSIS — Z96651 Presence of right artificial knee joint: Secondary | ICD-10-CM

## 2021-02-21 HISTORY — PX: TOTAL KNEE ARTHROPLASTY: SHX125

## 2021-02-21 SURGERY — ARTHROPLASTY, KNEE, TOTAL
Anesthesia: Spinal | Site: Knee | Laterality: Right

## 2021-02-21 MED ORDER — FENTANYL CITRATE (PF) 100 MCG/2ML IJ SOLN: 25.0000 ug | INTRAMUSCULAR | Status: DC | PRN

## 2021-02-21 MED ORDER — MAGNESIUM HYDROXIDE 400 MG/5ML PO SUSP: 30.0000 mL | Freq: Every day | ORAL | Status: DC | PRN

## 2021-02-21 MED ORDER — LEVOTHYROXINE SODIUM 50 MCG PO TABS
50.0000 ug | ORAL_TABLET | ORAL | Status: DC
  Administered 2021-02-22: 50 ug via ORAL
  Filled 2021-02-21: qty 1

## 2021-02-21 MED ORDER — DIPHENHYDRAMINE HCL 12.5 MG/5ML PO ELIX: 12.5000 mg | ORAL_SOLUTION | ORAL | Status: DC | PRN

## 2021-02-21 MED ORDER — MENTHOL 3 MG MT LOZG
1.0000 | LOZENGE | OROMUCOSAL | Status: DC | PRN
  Filled 2021-02-21: qty 9

## 2021-02-21 MED ORDER — DEXAMETHASONE SODIUM PHOSPHATE 10 MG/ML IJ SOLN
INTRAMUSCULAR | Status: AC
  Filled 2021-02-21: qty 1

## 2021-02-21 MED ORDER — ROPIVACAINE HCL 5 MG/ML IJ SOLN
INTRAMUSCULAR | Status: AC
  Filled 2021-02-21: qty 30

## 2021-02-21 MED ORDER — ASCORBIC ACID 500 MG PO TABS
1000.0000 mg | ORAL_TABLET | Freq: Every day | ORAL | Status: DC
  Administered 2021-02-21 – 2021-02-22 (×2): 1000 mg via ORAL
  Filled 2021-02-21 (×2): qty 2

## 2021-02-21 MED ORDER — DEXAMETHASONE SODIUM PHOSPHATE 10 MG/ML IJ SOLN
10.0000 mg | Freq: Once | INTRAMUSCULAR | Status: AC
  Administered 2021-02-22: 10 mg via INTRAVENOUS
  Filled 2021-02-21: qty 1

## 2021-02-21 MED ORDER — CELECOXIB 100 MG PO CAPS
200.0000 mg | ORAL_CAPSULE | Freq: Two times a day (BID) | ORAL | Status: DC
  Administered 2021-02-21 – 2021-02-22 (×2): 200 mg via ORAL
  Filled 2021-02-21 (×2): qty 2

## 2021-02-21 MED ORDER — PHENOL 1.4 % MT LIQD
1.0000 | OROMUCOSAL | Status: DC | PRN
  Administered 2021-02-21: 1 via OROMUCOSAL
  Filled 2021-02-21: qty 177

## 2021-02-21 MED ORDER — BUPIVACAINE IN DEXTROSE 0.75-8.25 % IT SOLN
INTRATHECAL | Status: DC | PRN
  Administered 2021-02-21: 11.25 mg via INTRATHECAL

## 2021-02-21 MED ORDER — BIOTIN 10 MG PO CAPS: 10.0000 mg | ORAL_CAPSULE | Freq: Every day | ORAL | Status: DC

## 2021-02-21 MED ORDER — LIDOCAINE HCL (PF) 2 % IJ SOLN
INTRAMUSCULAR | Status: AC
  Filled 2021-02-21: qty 5

## 2021-02-21 MED ORDER — MIDAZOLAM HCL 5 MG/5ML IJ SOLN
INTRAMUSCULAR | Status: DC | PRN
  Administered 2021-02-21: 2 mg via INTRAVENOUS

## 2021-02-21 MED ORDER — ASPIRIN 81 MG PO CHEW
81.0000 mg | CHEWABLE_TABLET | Freq: Two times a day (BID) | ORAL | Status: DC
  Administered 2021-02-21 – 2021-02-22 (×2): 81 mg via ORAL
  Filled 2021-02-21 (×2): qty 1

## 2021-02-21 MED ORDER — ORAL CARE MOUTH RINSE: 15.0000 mL | Freq: Once | OROMUCOSAL | Status: DC

## 2021-02-21 MED ORDER — ASPIRIN 81 MG PO TABS: 81.0000 mg | ORAL_TABLET | Freq: Every day | ORAL | Status: DC

## 2021-02-21 MED ORDER — EPINEPHRINE PF 1 MG/ML IJ SOLN
INTRAMUSCULAR | Status: AC
  Filled 2021-02-21: qty 1

## 2021-02-21 MED ORDER — ZOLPIDEM TARTRATE 5 MG PO TABS: 5.0000 mg | ORAL_TABLET | Freq: Every evening | ORAL | Status: DC | PRN

## 2021-02-21 MED ORDER — METHOCARBAMOL 1000 MG/10ML IJ SOLN
500.0000 mg | Freq: Four times a day (QID) | INTRAVENOUS | Status: DC | PRN
  Filled 2021-02-21: qty 5

## 2021-02-21 MED ORDER — TRAMADOL HCL 50 MG PO TABS
50.0000 mg | ORAL_TABLET | Freq: Four times a day (QID) | ORAL | Status: DC
  Administered 2021-02-21 – 2021-02-22 (×4): 50 mg via ORAL
  Filled 2021-02-21 (×4): qty 1

## 2021-02-21 MED ORDER — VITAMIN B-12 1000 MCG PO TABS
5000.0000 ug | ORAL_TABLET | Freq: Every day | ORAL | Status: DC
  Administered 2021-02-21 – 2021-02-22 (×2): 5000 ug via ORAL
  Filled 2021-02-21 (×2): qty 5

## 2021-02-21 MED ORDER — HYDROCODONE-ACETAMINOPHEN 7.5-325 MG PO TABS: 1.0000 | ORAL_TABLET | ORAL | Status: DC | PRN

## 2021-02-21 MED ORDER — OMEGA-3-ACID ETHYL ESTERS 1 G PO CAPS
1.0000 g | ORAL_CAPSULE | Freq: Every day | ORAL | Status: DC
Start: 2021-02-21 — End: 2021-02-22
  Administered 2021-02-21 – 2021-02-22 (×2): 1 g via ORAL
  Filled 2021-02-21 (×2): qty 1

## 2021-02-21 MED ORDER — LISINOPRIL 10 MG PO TABS
20.0000 mg | ORAL_TABLET | Freq: Every day | ORAL | Status: DC
  Administered 2021-02-21 – 2021-02-22 (×2): 20 mg via ORAL
  Filled 2021-02-21 (×2): qty 2

## 2021-02-21 MED ORDER — FENTANYL CITRATE (PF) 100 MCG/2ML IJ SOLN
INTRAMUSCULAR | Status: DC | PRN
  Administered 2021-02-21: 15 ug via INTRATHECAL

## 2021-02-21 MED ORDER — FENTANYL CITRATE (PF) 100 MCG/2ML IJ SOLN
INTRAMUSCULAR | Status: AC
  Filled 2021-02-21: qty 2

## 2021-02-21 MED ORDER — LACTATED RINGERS IV SOLN: INTRAVENOUS | Status: DC

## 2021-02-21 MED ORDER — DEXAMETHASONE SODIUM PHOSPHATE 10 MG/ML IJ SOLN
INTRAMUSCULAR | Status: DC | PRN
  Administered 2021-02-21: 5 mg via INTRAVENOUS

## 2021-02-21 MED ORDER — MORPHINE SULFATE (PF) 2 MG/ML IV SOLN
0.5000 mg | INTRAVENOUS | Status: DC | PRN
  Administered 2021-02-21: 1 mg via INTRAVENOUS
  Filled 2021-02-21: qty 1

## 2021-02-21 MED ORDER — PROPOFOL 10 MG/ML IV BOLUS
INTRAVENOUS | Status: DC | PRN
  Administered 2021-02-21 (×8): 20 mg via INTRAVENOUS

## 2021-02-21 MED ORDER — ALUM & MAG HYDROXIDE-SIMETH 200-200-20 MG/5ML PO SUSP: 30.0000 mL | ORAL | Status: DC | PRN

## 2021-02-21 MED ORDER — CELECOXIB 100 MG PO CAPS
200.0000 mg | ORAL_CAPSULE | Freq: Every day | ORAL | Status: DC
  Administered 2021-02-21: 200 mg via ORAL
  Filled 2021-02-21: qty 2

## 2021-02-21 MED ORDER — ADULT MULTIVITAMIN W/MINERALS CH
1.0000 | ORAL_TABLET | Freq: Every day | ORAL | Status: DC
  Administered 2021-02-21 – 2021-02-22 (×2): 1 via ORAL
  Filled 2021-02-21 (×2): qty 1

## 2021-02-21 MED ORDER — PROPOFOL 10 MG/ML IV BOLUS
INTRAVENOUS | Status: AC
  Filled 2021-02-21: qty 40

## 2021-02-21 MED ORDER — PROPOFOL 500 MG/50ML IV EMUL
INTRAVENOUS | Status: DC | PRN
  Administered 2021-02-21: 35 ug/kg/min via INTRAVENOUS
  Administered 2021-02-21: 40 ug/kg/min via INTRAVENOUS
  Administered 2021-02-21: 25 ug/kg/min via INTRAVENOUS

## 2021-02-21 MED ORDER — ACETAMINOPHEN 500 MG PO TABS
500.0000 mg | ORAL_TABLET | Freq: Once | ORAL | Status: AC
  Administered 2021-02-21: 500 mg via ORAL
  Filled 2021-02-21: qty 1

## 2021-02-21 MED ORDER — PROPOFOL 10 MG/ML IV BOLUS
INTRAVENOUS | Status: AC
  Filled 2021-02-21: qty 20

## 2021-02-21 MED ORDER — ONDANSETRON HCL 4 MG/2ML IJ SOLN
INTRAMUSCULAR | Status: DC | PRN
  Administered 2021-02-21: 4 mg via INTRAVENOUS

## 2021-02-21 MED ORDER — BUPIVACAINE-EPINEPHRINE (PF) 0.5% -1:200000 IJ SOLN
INTRAMUSCULAR | Status: AC
  Filled 2021-02-21: qty 30

## 2021-02-21 MED ORDER — B COMPLEX-C PO TABS
1.0000 | ORAL_TABLET | Freq: Every day | ORAL | Status: DC
  Administered 2021-02-21 – 2021-02-22 (×2): 1 via ORAL
  Filled 2021-02-21 (×2): qty 1

## 2021-02-21 MED ORDER — BUPIVACAINE LIPOSOME 1.3 % IJ SUSP
INTRAMUSCULAR | Status: AC
  Filled 2021-02-21: qty 20

## 2021-02-21 MED ORDER — PANTOPRAZOLE SODIUM 40 MG PO TBEC
40.0000 mg | DELAYED_RELEASE_TABLET | Freq: Every day | ORAL | Status: DC
  Administered 2021-02-21 – 2021-02-22 (×2): 40 mg via ORAL
  Filled 2021-02-21 (×2): qty 1

## 2021-02-21 MED ORDER — PRAVASTATIN SODIUM 10 MG PO TABS
10.0000 mg | ORAL_TABLET | Freq: Every day | ORAL | Status: DC
  Administered 2021-02-21: 10 mg via ORAL
  Filled 2021-02-21: qty 1

## 2021-02-21 MED ORDER — VITAMIN E 180 MG (400 UNIT) PO CAPS
400.0000 [IU] | ORAL_CAPSULE | Freq: Every day | ORAL | Status: DC
  Administered 2021-02-22: 400 [IU] via ORAL
  Filled 2021-02-21 (×2): qty 1

## 2021-02-21 MED ORDER — LEVOTHYROXINE SODIUM 25 MCG PO TABS: 25.0000 ug | ORAL_TABLET | ORAL | Status: DC

## 2021-02-21 MED ORDER — SODIUM CHLORIDE 0.9 % IV SOLN: INTRAVENOUS | Status: DC

## 2021-02-21 MED ORDER — FENTANYL CITRATE (PF) 100 MCG/2ML IJ SOLN
INTRAMUSCULAR | Status: DC | PRN
  Administered 2021-02-21: 20 ug via INTRAVENOUS
  Administered 2021-02-21: 15 ug via INTRAVENOUS
  Administered 2021-02-21: 25 ug via INTRAVENOUS
  Administered 2021-02-21 (×2): 12.5 ug via INTRAVENOUS

## 2021-02-21 MED ORDER — SENNA 8.6 MG PO TABS
2.0000 | ORAL_TABLET | Freq: Every day | ORAL | Status: DC
  Administered 2021-02-21: 17.2 mg via ORAL
  Filled 2021-02-21: qty 2

## 2021-02-21 MED ORDER — METHOCARBAMOL 500 MG PO TABS: 500.0000 mg | ORAL_TABLET | Freq: Four times a day (QID) | ORAL | Status: DC | PRN

## 2021-02-21 MED ORDER — ONDANSETRON HCL 4 MG/2ML IJ SOLN: 4.0000 mg | Freq: Four times a day (QID) | INTRAMUSCULAR | Status: DC | PRN

## 2021-02-21 MED ORDER — COQ10 100 MG PO CAPS: 100.0000 mg | ORAL_CAPSULE | Freq: Every day | ORAL | Status: DC

## 2021-02-21 MED ORDER — MIDAZOLAM HCL 2 MG/2ML IJ SOLN
INTRAMUSCULAR | Status: AC
  Filled 2021-02-21: qty 2

## 2021-02-21 MED ORDER — CHLORHEXIDINE GLUCONATE 0.12 % MT SOLN
OROMUCOSAL | Status: AC
  Administered 2021-02-21: 15 mL
  Filled 2021-02-21: qty 15

## 2021-02-21 MED ORDER — CEFAZOLIN SODIUM-DEXTROSE 2-4 GM/100ML-% IV SOLN
2.0000 g | Freq: Four times a day (QID) | INTRAVENOUS | Status: AC
Start: 2021-02-21 — End: 2021-02-21
  Administered 2021-02-21 (×2): 2 g via INTRAVENOUS
  Filled 2021-02-21 (×2): qty 100

## 2021-02-21 MED ORDER — SODIUM CHLORIDE 0.9 % IR SOLN
Status: DC | PRN
  Administered 2021-02-21: 3000 mL

## 2021-02-21 MED ORDER — POVIDONE-IODINE 10 % EX SWAB: 2.0000 "application " | Freq: Once | CUTANEOUS | Status: DC

## 2021-02-21 MED ORDER — LIDOCAINE HCL (CARDIAC) PF 50 MG/5ML IV SOSY
PREFILLED_SYRINGE | INTRAVENOUS | Status: DC | PRN
  Administered 2021-02-21: 50 mg via INTRAVENOUS

## 2021-02-21 MED ORDER — METOCLOPRAMIDE HCL 10 MG PO TABS: 5.0000 mg | ORAL_TABLET | Freq: Three times a day (TID) | ORAL | Status: DC | PRN

## 2021-02-21 MED ORDER — CEFAZOLIN SODIUM-DEXTROSE 2-4 GM/100ML-% IV SOLN
2.0000 g | INTRAVENOUS | Status: AC
  Administered 2021-02-21: 2 g via INTRAVENOUS
  Filled 2021-02-21: qty 100

## 2021-02-21 MED ORDER — TRANEXAMIC ACID-NACL 1000-0.7 MG/100ML-% IV SOLN
1000.0000 mg | INTRAVENOUS | Status: AC
  Administered 2021-02-21: 1000 mg via INTRAVENOUS
  Filled 2021-02-21: qty 100

## 2021-02-21 MED ORDER — HYDROCODONE-ACETAMINOPHEN 5-325 MG PO TABS
1.0000 | ORAL_TABLET | ORAL | Status: DC | PRN
  Administered 2021-02-21 (×2): 1 via ORAL
  Filled 2021-02-21 (×2): qty 1

## 2021-02-21 MED ORDER — ONDANSETRON HCL 4 MG PO TABS: 4.0000 mg | ORAL_TABLET | Freq: Four times a day (QID) | ORAL | Status: DC | PRN

## 2021-02-21 MED ORDER — BUPIVACAINE LIPOSOME 1.3 % IJ SUSP
INTRAMUSCULAR | Status: DC | PRN
  Administered 2021-02-21: 20 mL

## 2021-02-21 MED ORDER — HYDROCODONE-ACETAMINOPHEN 5-325 MG PO TABS
1.0000 | ORAL_TABLET | Freq: Once | ORAL | Status: AC
  Administered 2021-02-21: 1 via ORAL
  Filled 2021-02-21: qty 1

## 2021-02-21 MED ORDER — TRANEXAMIC ACID-NACL 1000-0.7 MG/100ML-% IV SOLN
1000.0000 mg | Freq: Once | INTRAVENOUS | Status: AC
  Administered 2021-02-21: 1000 mg via INTRAVENOUS
  Filled 2021-02-21: qty 100

## 2021-02-21 MED ORDER — DOCUSATE SODIUM 100 MG PO CAPS
100.0000 mg | ORAL_CAPSULE | Freq: Two times a day (BID) | ORAL | Status: DC
  Administered 2021-02-21 – 2021-02-22 (×2): 100 mg via ORAL
  Filled 2021-02-21 (×2): qty 1

## 2021-02-21 MED ORDER — METOCLOPRAMIDE HCL 5 MG/ML IJ SOLN
5.0000 mg | Freq: Three times a day (TID) | INTRAMUSCULAR | Status: DC | PRN
Start: 2021-02-21 — End: 2021-02-22

## 2021-02-21 MED ORDER — ONDANSETRON HCL 4 MG/2ML IJ SOLN
INTRAMUSCULAR | Status: AC
  Filled 2021-02-21: qty 2

## 2021-02-21 MED ORDER — 0.9 % SODIUM CHLORIDE (POUR BTL) OPTIME
TOPICAL | Status: DC | PRN
  Administered 2021-02-21: 1000 mL

## 2021-02-21 MED ORDER — CALCIUM CARBONATE-VITAMIN D 500-200 MG-UNIT PO TABS
2.0000 | ORAL_TABLET | ORAL | Status: DC
  Administered 2021-02-22: 2 via ORAL
  Filled 2021-02-21 (×2): qty 2

## 2021-02-21 MED ORDER — CHLORHEXIDINE GLUCONATE 0.12 % MT SOLN: 15.0000 mL | Freq: Once | OROMUCOSAL | Status: DC

## 2021-02-21 MED ORDER — ACETAMINOPHEN 500 MG PO TABS
500.0000 mg | ORAL_TABLET | Freq: Four times a day (QID) | ORAL | Status: AC
  Administered 2021-02-21 – 2021-02-22 (×4): 500 mg via ORAL
  Filled 2021-02-21 (×4): qty 1

## 2021-02-21 SURGICAL SUPPLY — 68 items
ATTUNE PS FEM RT SZ 5 CEM KNEE (Femur) ×1 IMPLANT
BANDAGE ESMARK 6X9 LF (GAUZE/BANDAGES/DRESSINGS) ×1 IMPLANT
BASEPLATE TIB CMT FB PCKT SZ3 (Knees) ×1 IMPLANT
BLADE HEX COATED 2.75 (ELECTRODE) ×2 IMPLANT
BLADE SAGITTAL 25.0X1.27X90 (BLADE) ×2 IMPLANT
BLADE SAW SGTL 11.0X1.19X90.0M (BLADE) ×2 IMPLANT
BNDG CMPR 9X6 STRL LF SNTH (GAUZE/BANDAGES/DRESSINGS) ×1
BNDG CMPR STD VLCR NS LF 5.8X4 (GAUZE/BANDAGES/DRESSINGS) ×2
BNDG CMPR STD VLCR NS LF 5.8X6 (GAUZE/BANDAGES/DRESSINGS) ×1
BNDG ELASTIC 4X5.8 VLCR NS LF (GAUZE/BANDAGES/DRESSINGS) ×4 IMPLANT
BNDG ELASTIC 6X5.8 VLCR NS LF (GAUZE/BANDAGES/DRESSINGS) ×2 IMPLANT
BNDG ESMARK 6X9 LF (GAUZE/BANDAGES/DRESSINGS) ×2
BSPLAT TIB 3 CMNT FXBRNG STRL (Knees) ×1 IMPLANT
CEMENT HV SMART SET (Cement) ×4 IMPLANT
CLOTH BEACON ORANGE TIMEOUT ST (SAFETY) ×2 IMPLANT
COMP PATELLA ATTUNE 29MM (Knees) ×2 IMPLANT
COMPONENT PATELLA ATTUNE 29MM (Knees) IMPLANT
COOLER ICEMAN CLASSIC (MISCELLANEOUS) ×2 IMPLANT
COVER LIGHT HANDLE STERIS (MISCELLANEOUS) ×4 IMPLANT
CUFF TOURN SGL QUICK 34 (TOURNIQUET CUFF) ×2
CUFF TRNQT CYL 34X4.125X (TOURNIQUET CUFF) ×1 IMPLANT
DRAPE BACK TABLE (DRAPES) ×2 IMPLANT
DRAPE EXTREMITY T 121X128X90 (DISPOSABLE) ×2 IMPLANT
DRSG MEPILEX BORDER 4X12 (GAUZE/BANDAGES/DRESSINGS) ×2 IMPLANT
DURAPREP 26ML APPLICATOR (WOUND CARE) ×4 IMPLANT
ELECT REM PT RETURN 9FT ADLT (ELECTROSURGICAL) ×2
ELECTRODE REM PT RTRN 9FT ADLT (ELECTROSURGICAL) ×1 IMPLANT
GLOVE SKINSENSE NS SZ8.0 LF (GLOVE) ×2
GLOVE SKINSENSE STRL SZ8.0 LF (GLOVE) ×2 IMPLANT
GLOVE SS N UNI LF 8.5 STRL (GLOVE) ×2 IMPLANT
GLOVE SURG UNDER POLY LF SZ7 (GLOVE) ×4 IMPLANT
GOWN STRL REUS W/TWL LRG LVL3 (GOWN DISPOSABLE) ×5 IMPLANT
GOWN STRL REUS W/TWL XL LVL3 (GOWN DISPOSABLE) ×2 IMPLANT
HANDPIECE INTERPULSE COAX TIP (DISPOSABLE) ×2
HOOD W/PEELAWAY (MISCELLANEOUS) ×7 IMPLANT
INSERT TIB ATTUNE FB SZ5X14 (Insert) ×1 IMPLANT
INST SET MAJOR BONE (KITS) ×2 IMPLANT
IV NS IRRIG 3000ML ARTHROMATIC (IV SOLUTION) ×2 IMPLANT
KIT BLADEGUARD II DBL (SET/KITS/TRAYS/PACK) ×2 IMPLANT
KIT TURNOVER KIT A (KITS) ×2 IMPLANT
MANIFOLD NEPTUNE II (INSTRUMENTS) ×2 IMPLANT
MARKER SKIN DUAL TIP RULER LAB (MISCELLANEOUS) ×2 IMPLANT
NDL HYPO 18GX1.5 BLUNT FILL (NEEDLE) IMPLANT
NDL HYPO 21X1.5 SAFETY (NEEDLE) IMPLANT
NEEDLE HYPO 18GX1.5 BLUNT FILL (NEEDLE) ×2 IMPLANT
NEEDLE HYPO 21X1.5 SAFETY (NEEDLE) ×2 IMPLANT
NS IRRIG 1000ML POUR BTL (IV SOLUTION) ×2 IMPLANT
PACK TOTAL JOINT (CUSTOM PROCEDURE TRAY) ×2 IMPLANT
PAD ARMBOARD 7.5X6 YLW CONV (MISCELLANEOUS) ×2 IMPLANT
PAD COLD SHLDR WRAP-ON (PAD) ×2 IMPLANT
PENCIL SMOKE EVACUATOR (MISCELLANEOUS) ×2 IMPLANT
PILLOW KNEE EXTENSION 0 DEG (MISCELLANEOUS) ×2 IMPLANT
PIN/DRILL PACK ORTHO 1/8X3.0 (PIN) ×2 IMPLANT
SAW OSC TIP CART 19.5X105X1.3 (SAW) ×2 IMPLANT
SET BASIN LINEN APH (SET/KITS/TRAYS/PACK) ×2 IMPLANT
SET HNDPC FAN SPRY TIP SCT (DISPOSABLE) ×1 IMPLANT
STAPLER VISISTAT 35W (STAPLE) ×2 IMPLANT
SUT BRALON NAB BRD #1 30IN (SUTURE) ×3 IMPLANT
SUT MNCRL 0 VIOLET CTX 36 (SUTURE) ×1 IMPLANT
SUT MON AB 0 CT1 (SUTURE) ×2 IMPLANT
SUT MONOCRYL 0 CTX 36 (SUTURE) ×2
SYR 20ML LL LF (SYRINGE) ×1 IMPLANT
SYR BULB IRRIG 60ML STRL (SYRINGE) ×2 IMPLANT
TOWEL OR 17X26 4PK STRL BLUE (TOWEL DISPOSABLE) ×2 IMPLANT
TOWER CARTRIDGE SMART MIX (DISPOSABLE) ×2 IMPLANT
TRAY FOLEY MTR SLVR 16FR STAT (SET/KITS/TRAYS/PACK) ×2 IMPLANT
WATER STERILE IRR 1000ML POUR (IV SOLUTION) ×4 IMPLANT
YANKAUER SUCT 12FT TUBE ARGYLE (SUCTIONS) ×2 IMPLANT

## 2021-02-21 NOTE — Transfer of Care (Signed)
Immediate Anesthesia Transfer of Care Note  Patient: April Duran  Procedure(s) Performed: TOTAL KNEE ARTHROPLASTY (Right: Knee)  Patient Location: PACU  Anesthesia Type:Spinal  Level of Consciousness: awake and patient cooperative  Airway & Oxygen Therapy: Patient Spontanous Breathing  Post-op Assessment: Report given to RN and Post -op Vital signs reviewed and stable  Post vital signs: Reviewed and stable  Last Vitals:  Vitals Value Taken Time  BP    Temp 97.8   Pulse    Resp    SpO2      Last Pain:  Vitals:   02/21/21 0700  PainSc: 0-No pain         Complications: No notable events documented.

## 2021-02-21 NOTE — Plan of Care (Signed)
  Problem: Acute Rehab PT Goals(only PT should resolve) Goal: Pt Will Go Supine/Side To Sit Outcome: Progressing Flowsheets (Taken 02/21/2021 1547) Pt will go Supine/Side to Sit: with modified independence Goal: Patient Will Transfer Sit To/From Stand Outcome: Progressing Flowsheets (Taken 02/21/2021 1547) Patient will transfer sit to/from stand:  with supervision  with modified independence Goal: Pt Will Transfer Bed To Chair/Chair To Bed Outcome: Progressing Flowsheets (Taken 02/21/2021 1547) Pt will Transfer Bed to Chair/Chair to Bed:  with supervision  with modified independence Goal: Pt Will Ambulate Outcome: Progressing Flowsheets (Taken 02/21/2021 1547) Pt will Ambulate:  100 feet  with modified independence  with supervision  with rolling walker   3:49 PM, 02/21/21 Lonell Grandchild, MPT Physical Therapist with [facility] [phone] office [phone] mobile phone

## 2021-02-21 NOTE — Evaluation (Signed)
Physical Therapy Evaluation Patient Details Name: April Duran MRN: [id number] DOB: July 03, 1941 Today's Date: 02/21/2021  RIGHT KNEE ROM:  0 - 118 degrees AMBULATION DISTANCE: 30 feet using RW with Min/Min guard assist   History of Present Illness  April Duran is a 80 y/o female, s/p Right TKA on 02/21/21 , with the diagnosis of right knee osteoarthritis.   Clinical Impression  Patient demonstrates good return for moving RLE during bed mobility and tolerated up to 118 degrees right knee flexion self stretching while seated at bedside, limited for ambulation mostly due to c/o fatigue with slow labored cadence and verbal/tactile cueing to step closer to RW due to extremely kyphotic trunk.  Patient tolerated sitting up in chair after therapy with her daughter in room.  Patient will benefit from continued physical therapy in hospital and recommended venue below to increase strength, balance, endurance for safe ADLs and gait.      Follow Up Recommendations Home health PT;Supervision - Intermittent;Supervision for mobility/OOB    Equipment Recommendations  3in1 (PT)    Recommendations for Other Services       Precautions / Restrictions Precautions Precautions: Fall Restrictions Weight Bearing Restrictions: Yes RLE Weight Bearing: Weight bearing as tolerated      Mobility  Bed Mobility Overal bed mobility: Needs Assistance Bed Mobility: Supine to Sit     Supine to sit: Supervision     General bed mobility comments: increased time, labored movement, good return for moving RLE    Transfers Overall transfer level: Needs assistance   Transfers: Sit to/from Stand;Stand Pivot Transfers Sit to Stand: Min guard;Min assist Stand pivot transfers: Min guard;Min assist       General transfer comment: unsteady labored movement  Ambulation/Gait Ambulation/Gait assistance: Min guard;Min assist Gait Distance (Feet): 30 Feet Assistive device: Rolling walker (2 wheeled) Gait  Pattern/deviations: Decreased step length - right;Decreased step length - left;Decreased stance time - right;Decreased stride length;Antalgic;Trunk flexed Gait velocity: decreased   General Gait Details: slow labored cadence with tendency to push RW to far in front mostly due to very kyphotic trunk, no loss of balance, limited mostly due to fatigue  Stairs            Wheelchair Mobility    Modified Rankin (Stroke Patients Only)       Balance Overall balance assessment: Needs assistance Sitting-balance support: Feet supported;No upper extremity supported Sitting balance-Leahy Scale: Good Sitting balance - Comments: seated at EOB   Standing balance support: During functional activity;Bilateral upper extremity supported Standing balance-Leahy Scale: Fair Standing balance comment: using RW                             Pertinent Vitals/Pain Pain Assessment: Faces Faces Pain Scale: Hurts a little bit Pain Location: right knee Pain Descriptors / Indicators: Sore;Discomfort Pain Intervention(s): Limited activity within patient's tolerance;Monitored during session;Repositioned;RN gave pain meds during session    [place] expects to be discharged to:: Private residence Living Arrangements: Spouse/significant other Available Help at Discharge: Family;Available 24 hours/day Type of Home: House Home Access: Stairs to enter Entrance Stairs-Rails: Left;Right (bilateral vertical post) Entrance Stairs-Number of Steps: 2 Home Layout: One level;Laundry or work area in basement;Other (Comment) (Patient states she will not go into basement) Home Equipment: Walker - 2 wheels;Grab bars - toilet      Prior Function Level of Independence: Independent         Comments: community ambulator, drives  Hand Dominance   Dominant Hand: Right    Extremity/Trunk Assessment   Upper Extremity Assessment Upper Extremity Assessment: Defer to OT evaluation     Lower Extremity Assessment Lower Extremity Assessment: Generalized weakness;RLE deficits/detail RLE Deficits / Details: grossly -4/5 RLE: Unable to fully assess due to pain RLE Sensation: WNL RLE Coordination: WNL    Cervical / Trunk Assessment Cervical / Trunk Assessment: Kyphotic  Communication   Communication: No difficulties  Cognition Arousal/Alertness: Awake/alert Behavior During Therapy: WFL for tasks assessed/performed Overall Cognitive Status: Within Functional Limits for tasks assessed                                        General Comments      Exercises Total Joint Exercises Ankle Circles/Pumps: Supine;10 reps;Right;Strengthening;AROM Quad Sets: Supine;10 reps;Right;Strengthening;AROM Short Arc Quad: Supine;10 reps;Right;Strengthening;AROM Heel Slides: Supine;10 reps;Right;Strengthening;AROM Goniometric ROM: right knee: 0 - 118 degrees   Assessment/Plan    PT Assessment Patient needs continued PT services  PT Problem List Decreased strength;Decreased activity tolerance;Decreased balance;Decreased mobility;Decreased range of motion       PT Treatment Interventions DME instruction;Gait training;Stair training;Therapeutic activities;Therapeutic exercise;Functional mobility training;Patient/family education;Balance training    PT Goals (Current goals can be found in the Care Plan section)  Acute Rehab PT Goals Patient Stated Goal: return home with family to assist PT Goal Formulation: With patient/family Time For Goal Achievement: 02/23/21 Potential to Achieve Goals: Good    Frequency BID   Barriers to discharge        Co-evaluation               AM-PAC PT "6 Clicks" Mobility  Outcome Measure Help needed turning from your back to your side while in a flat bed without using bedrails?: None Help needed moving from lying on your back to sitting on the side of a flat bed without using bedrails?: A Little Help needed moving to and  from a bed to a chair (including a wheelchair)?: A Little Help needed standing up from a chair using your arms (e.g., wheelchair or bedside chair)?: A Little Help needed to walk in hospital room?: A Little Help needed climbing 3-5 steps with a railing? : A Lot 6 Click Score: 18    End of Session   Activity Tolerance: Patient tolerated treatment well;Patient limited by fatigue Patient left: in chair;with family/visitor present Nurse Communication: Mobility status PT Visit Diagnosis: Unsteadiness on feet (R26.81);Other abnormalities of gait and mobility (R26.89);Muscle weakness (generalized) (M62.81)    Time: [id number] PT Time Calculation (min) (ACUTE ONLY): 32 min   Charges:   PT Evaluation $PT Eval Moderate Complexity: 1 Mod PT Treatments $Therapeutic Activity: 23-37 mins        3:46 PM, 02/21/21 Lonell Grandchild, MPT Physical Therapist with [facility] [phone] office [phone] mobile phone

## 2021-02-21 NOTE — Progress Notes (Signed)
Instructed on incentive spirometer. 1250 ml obtained. Tolerated well. 

## 2021-02-21 NOTE — Anesthesia Procedure Notes (Signed)
Spinal  Patient location during procedure: OR Start time: 02/21/2021 7:43 AM End time: 02/21/2021 7:51 AM Reason for block: surgical anesthesia Staffing Anesthesiologist: Louann Sjogren, MD Resident/CRNA: Vista Deck, CRNA Preanesthetic Checklist Completed: patient identified, IV checked, site marked, risks and benefits discussed, surgical consent, monitors and equipment checked, pre-op evaluation and timeout performed Spinal Block Patient position: right lateral decubitus Prep: Betadine Patient monitoring: heart rate, cardiac monitor, continuous pulse ox and blood pressure Approach: right paramedian Location: L3-4 Injection technique: single-shot Needle Needle type: Spinocan  Needle gauge: 22 G Needle length: 9 cm Assessment Sensory level: T8 Events: CSF return Additional Notes ATTEMPTS: 4 TRAY ID: LOT # [id number] TRAY EXPIRATION DATE: 06-28-2022 2 attempts made with 24 pencan  and 1 with 22 Spinocan without success by T. Millenia Waldvogel CRNA Successful insertion by DR. Kiel

## 2021-02-21 NOTE — Op Note (Addendum)
Dictation for total knee replacement  02/21/2021  10:35 AM  Preop diagnosis osteoarthritis right KNEE  Postop diagnosis osteoarthritis right KNEE  Procedure right total knee arthroplasty  Findings preoperatively the patient had a 5 degree flexion contracture and 105 degrees knee flexion.  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  Implants   DePuy   Attune, fixed-bearing posterior stabilized total knee: Sizes: Femur   5   tibia 3   patella 29   POLYthylene 14 PS  Bone cuts:  Distal femur 11 total  PROXIMAL TIBIA 6 total reference medial side  PATELLA 6 mm, patella thickness was 18, cut down to 12         ASSISTANTS: Fulton Mole  ANESTHESIA:   Spinal with postop saphenous nerve block  BLOOD ADMINISTERED:none  DRAINS: none   LOCAL MEDICATIONS USED:  EXPAREL  SPECIMEN:  No Specimen  DISPOSITION OF SPECIMEN:  N/A  COUNTS:  YES  TOURNIQUET: 113 minutes  DICTATION: .Dragon Dictation   The patient was taken to the recovery room in stable condition  PLAN OF CARE: Admit for observation  PATIENT DISPOSITION:  PACU - hemodynamically stable.   Delay start of Pharmacological VTE agent (>24hrs) due to surgical blood loss or risk of bleeding: not applicable  Details of surgery: The patient was identified by 2 approved identification mechanisms. The operative extremity was evaluated and found to be acceptable for surgical treatment today. The chart was reviewed. The surgical site was confirmed and marked.   The patient was taken to the operating room and given appropriate antibiotic 2 g of Ancef. This is consistent with the SCIP protocol.  The patient was given the following anesthetic: Spinal with a  postop PACU saphenous nerve block  The patient was then placed supine on the operating table. A Foley catheter was inserted. The operative extremity was prepped and draped sterilely from the toes to the groin.  Timeout was executed confirming the patient's name, surgical site, antibiotic administration, x-rays available, and implants available.  The operative limb,  was exsanguinated with a six-inch Esmarch and the tourniquet was inflated to 280 mmHg.  A straight midline incision was made over the right KNEE and taken down to the extensor mechanism. A medial arthrotomy was performed. The patella was everted and the patellofemoral soft tissue was released, along with the patellar fat pad.  The anterior cruciate ligament and PCL were resected.  The patient had a large lateral suprapatellar bony body which had the appearance of osteochondromatosis that was several of these in the knee which were immediately removed on entrance into the joint.  Several others removed later as the posterior bone cuts were made  The anterior horns of the lateral and medial meniscus were resected. The medial soft tissue sleeve was elevated only anteriorly.  The tibia was cut first  The external alignment guide for the tibial resection was then applied to the distal and proximal tibia and set for anatomic slope along with 6 MM resection  from the medial side.   Rotational alignment was set using the malleolus, the tibial tubercle and the tibial spines.  The proximal tibia was resected along with  residual menisci. The tibia was sized using a base plate to a size 3  Next the femur was cut  distally A three-eighths inch drill bit was used to enter the femoral canal which was decompressed with suction and irrigation until clear.   The distal femoral cutting guide was set for 9 mm distal resection,  3valgus alignment, for a right knee. The distal femur was resected and checked for flatness.  The extension gap was  checked.  Additional 2 mm was resected from the distal femur and a 5 mm block was placed and seem to be stable.  The Depuy Sigma sizing femoral guide was placed and the femur was sized to a size 4.     A 4-in-1 cutting block was placed along with collateral ligament retractors and the distal femoral cuts were completed.  Spacer blocks were used to evaluate flexion extension gaps the 5 cutting block seem to fit nicely and I proceeded with the anterior and posterior cuts.  A size 14 MM spacer block was needed to provide a good flexion gap stability  At this point the extension gap could only fit a 12 mm spacer.  Laminar spreaders were placed and the pie crusting technique was used to release the iliotibial band until a 14 spacer block could be placed  We proceeded with the notch cutting guide followed by the trial reduction  Trial reduction was completed using size 4 femur 3 tibia and 14 block.  The flexion gap was still loose.  The thickest tibial block was 14 I therefore upsized the femur to a size 5 with stabilize the flexion gap  atella tracking was normal  We then skeletonized the patella. It measured 18 I made a freehand cut and remeasured the patella to 12 and then used a 29 button with 8.5 thickness for total thickness of 20.5   The proximal tibia was prepared using the size 3 base plate.  Thorough irrigation was performed and the bone was dried and prepared for cement. The cement was mixed on the back table using third generation preparation techniques   20 cc of dilute Exparel was injected into the soft tissues including the posterior capsule.    The implants were then cemented in place and excess cement was removed. The cement was allowed to cure. Irrigation was repeated and excess and residual bone fragments and cement were removed.  The extensor mechanism was closed with #1 Bralon suture followed by subcutaneous tissue closure using 0 Monocryl suture and 2-0  Monocryl   Skin approximation was performed using staples  A sterile dressing was applied, followed by a application of Ace bandage.  In the PACU after the block a TED hose was placed along with a Cryo/Cuff   The patient was taken recovery room in stable condition

## 2021-02-21 NOTE — Brief Op Note (Signed)
02/21/2021  10:33 AM  PATIENT:  Bo Mcclintock  80 y.o. female  PRE-OPERATIVE DIAGNOSIS:  right knee osteoarthritis  POST-OPERATIVE DIAGNOSIS:  right knee osteoarthritis  PROCEDURE:  Procedure(s): TOTAL KNEE ARTHROPLASTY (Right)  SURGEON:  Surgeon(s) and Role:    Carole Civil, MD - Primary  PHYSICIAN ASSISTANT:   ASSISTANTS: Fulton Mole  ANESTHESIA:   spinal postop saphenous nerve block  EBL:  10 mL   BLOOD ADMINISTERED:none  DRAINS: none   LOCAL MEDICATIONS USED:  Amount: 20 ml full-strength exparel  SPECIMEN:  No Specimen  DISPOSITION OF SPECIMEN:  N/A  COUNTS:  YES  TOURNIQUET:   Total Tourniquet Time Documented: Thigh (Right) - 113 minutes Total: Thigh (Right) - 113 minutes   DICTATION: .Viviann Spare Dictation  PLAN OF CARE: Admit for overnight observation  PATIENT DISPOSITION:  PACU - hemodynamically stable.   Delay start of Pharmacological VTE agent (>24hrs) due to surgical blood loss or risk of bleeding: yes

## 2021-02-21 NOTE — Anesthesia Postprocedure Evaluation (Signed)
Anesthesia Post Note  Patient: April Duran  Procedure(s) Performed: TOTAL KNEE ARTHROPLASTY (Right: Knee)  Patient location during evaluation: Phase II Anesthesia Type: Spinal Level of consciousness: awake Pain management: pain level controlled Vital Signs Assessment: post-procedure vital signs reviewed and stable Respiratory status: spontaneous breathing and respiratory function stable Cardiovascular status: blood pressure returned to baseline and stable Postop Assessment: no headache and no apparent nausea or vomiting Anesthetic complications: no Comments: Late entry   No notable events documented.   Last Vitals:  Vitals:   02/21/21 1332 02/21/21 1333  BP: (!) 130/59 (!) 143/66  Pulse: 67 67  Resp: 17 17  Temp: 36.4 C 36.4 C  SpO2:  99%    Last Pain:  Vitals:   02/21/21 1333  TempSrc: Oral  PainSc:                  Louann Sjogren

## 2021-02-21 NOTE — Anesthesia Preprocedure Evaluation (Signed)
Anesthesia Evaluation  Patient identified by MRN, date of birth, ID band Patient awake    Reviewed: Allergy & Precautions, H&P , NPO status , Patient's Chart, lab work & pertinent test results, reviewed documented beta blocker date and time   Airway Mallampati: II  TM Distance: >3 FB Neck ROM: full    Dental no notable dental hx.    Pulmonary neg pulmonary ROS,    Pulmonary exam normal breath sounds clear to auscultation       Cardiovascular Exercise Tolerance: Good hypertension, negative cardio ROS   Rhythm:regular Rate:Normal     Neuro/Psych negative neurological ROS  negative psych ROS   GI/Hepatic negative GI ROS, Neg liver ROS,   Endo/Other  Hypothyroidism   Renal/GU negative Renal ROS  negative genitourinary   Musculoskeletal   Abdominal   Peds  Hematology negative hematology ROS (+)   Anesthesia Other Findings   Reproductive/Obstetrics negative OB ROS                             Anesthesia Physical Anesthesia Plan  ASA: 2  Anesthesia Plan: Spinal   Post-op Pain Management:  Regional for Post-op pain   Induction:   PONV Risk Score and Plan: Propofol infusion  Airway Management Planned:   Additional Equipment:   Intra-op Plan:   Post-operative Plan:   Informed Consent: I have reviewed the patients History and Physical, chart, labs and discussed the procedure including the risks, benefits and alternatives for the proposed anesthesia with the patient or authorized representative who has indicated his/her understanding and acceptance.     Dental Advisory Given  Plan Discussed with: CRNA  Anesthesia Plan Comments:         Anesthesia Quick Evaluation

## 2021-02-21 NOTE — Anesthesia Procedure Notes (Addendum)
Date/Time: 02/21/2021 7:35 AM Performed by: Vista Deck, CRNA Pre-anesthesia Checklist: Patient identified, Emergency Drugs available, Suction available, Timeout performed and Patient being monitored Patient Re-evaluated:Patient Re-evaluated prior to induction Oxygen Delivery Method: Nasal Cannula

## 2021-02-21 NOTE — Interval H&P Note (Signed)
History and Physical Interval Note:  02/21/2021 7:24 AM  April Duran  has presented today for surgery, with the diagnosis of right knee osteoarthritis.  The various methods of treatment have been discussed with the patient and family. After consideration of risks, benefits and other options for treatment, the patient has consented to  Procedure(s): TOTAL KNEE ARTHROPLASTY (Right) as a surgical intervention.  The patient's history has been reviewed, patient examined, no change in status, stable for surgery.  I have reviewed the patient's chart and labs.  Questions were answered to the patient's satisfaction.     Arther Abbott

## 2021-02-22 ENCOUNTER — Other Ambulatory Visit: Payer: Self-pay

## 2021-02-22 DIAGNOSIS — Z7982 Long term (current) use of aspirin: Secondary | ICD-10-CM | POA: Diagnosis not present

## 2021-02-22 DIAGNOSIS — Z79899 Other long term (current) drug therapy: Secondary | ICD-10-CM | POA: Diagnosis not present

## 2021-02-22 DIAGNOSIS — M1711 Unilateral primary osteoarthritis, right knee: Secondary | ICD-10-CM | POA: Diagnosis not present

## 2021-02-22 DIAGNOSIS — I10 Essential (primary) hypertension: Secondary | ICD-10-CM | POA: Diagnosis not present

## 2021-02-22 DIAGNOSIS — E039 Hypothyroidism, unspecified: Secondary | ICD-10-CM | POA: Diagnosis not present

## 2021-02-22 LAB — CBC
HCT: 32 % — ABNORMAL LOW (ref 36.0–46.0)
Hemoglobin: 10.8 g/dL — ABNORMAL LOW (ref 12.0–15.0)
MCH: 32.2 pg (ref 26.0–34.0)
MCHC: 33.8 g/dL (ref 30.0–36.0)
MCV: 95.5 fL (ref 80.0–100.0)
Platelets: 280 10*3/uL (ref 150–400)
RBC: 3.35 MIL/uL — ABNORMAL LOW (ref 3.87–5.11)
RDW: 12.2 % (ref 11.5–15.5)
WBC: 16.4 10*3/uL — ABNORMAL HIGH (ref 4.0–10.5)
nRBC: 0 % (ref 0.0–0.2)

## 2021-02-22 LAB — BASIC METABOLIC PANEL
Anion gap: 6 (ref 5–15)
BUN: 10 mg/dL (ref 8–23)
CO2: 26 mmol/L (ref 22–32)
Calcium: 8.2 mg/dL — ABNORMAL LOW (ref 8.9–10.3)
Chloride: 102 mmol/L (ref 98–111)
Creatinine, Ser: 0.62 mg/dL (ref 0.44–1.00)
GFR, Estimated: >60 mL/min (ref >60)
Glucose, Bld: 144 mg/dL — ABNORMAL HIGH (ref 70–99)
Potassium: 4.4 mmol/L (ref 3.5–5.1)
Sodium: 134 mmol/L — ABNORMAL LOW (ref 135–145)

## 2021-02-22 MED ORDER — DOCUSATE SODIUM 100 MG PO CAPS: 100.0000 mg | ORAL_CAPSULE | Freq: Two times a day (BID) | ORAL | 0 refills | Status: AC

## 2021-02-22 MED ORDER — HYDROCODONE-ACETAMINOPHEN 5-325 MG PO TABS: 1.0000 | ORAL_TABLET | ORAL | 0 refills | Status: DC | PRN

## 2021-02-22 MED ORDER — TRAMADOL HCL 50 MG PO TABS: 50.0000 mg | ORAL_TABLET | Freq: Four times a day (QID) | ORAL | 0 refills | Status: DC

## 2021-02-22 MED ORDER — ASPIRIN 81 MG PO CHEW: 81.0000 mg | CHEWABLE_TABLET | Freq: Two times a day (BID) | ORAL | 0 refills | Status: AC

## 2021-02-22 MED ORDER — CELECOXIB 200 MG PO CAPS: 200.0000 mg | ORAL_CAPSULE | Freq: Two times a day (BID) | ORAL | 0 refills | Status: AC

## 2021-02-22 MED ORDER — CHLORHEXIDINE GLUCONATE CLOTH 2 % EX PADS
6.0000 | MEDICATED_PAD | Freq: Every day | CUTANEOUS | Status: DC
  Administered 2021-02-22: 6 via TOPICAL

## 2021-02-22 MED ORDER — TIZANIDINE HCL 2 MG PO TABS: 2.0000 mg | ORAL_TABLET | Freq: Four times a day (QID) | ORAL | 2 refills | Status: AC | PRN

## 2021-02-22 NOTE — Evaluation (Signed)
Occupational Therapy Evaluation Patient Details Name: April Duran MRN: [id number] DOB: [birth year] Today's Date: 02/22/2021    History of Present Illness [name] is a 80 y/o female, s/p Right TKA on 02/21/21 , with the diagnosis of right knee osteoarthritis.   Clinical Impression   Pt agreeable to OT evaluation with PT treating. Pt demonstrates ambulatory transfers with Min G assist using RW. Mod I to SPV for bed mobility. General UE weakness demonstrated via MMT testing. Pt will benefit from continued OT in the hospital and recommended venue below to increase strength, balance, and endurance for safe ADL's.     Follow Up Recommendations  Home health OT;Supervision/Assistance - 24 hour    Equipment Recommendations  None recommended by OT           Precautions / Restrictions Precautions Precautions: Fall Restrictions Weight Bearing Restrictions: Yes RLE Weight Bearing: Weight bearing as tolerated      Mobility Bed Mobility Overal bed mobility: Modified Independent Bed Mobility: Supine to Sit     Supine to sit: Supervision;Modified independent (Device/Increase time)     General bed mobility comments: labored movement    Transfers Overall transfer level: Needs assistance Equipment used: Rolling walker (2 wheeled) Transfers: Sit to/from [facility] Sit to Stand: Min guard Stand pivot transfers: Min guard       General transfer comment: slightly labored movement    Balance Overall balance assessment: Needs assistance Sitting-balance support: Feet supported;No upper extremity supported Sitting balance-Leahy Scale: Good Sitting balance - Comments: seated at EOB   Standing balance support: During functional activity;Bilateral upper extremity supported Standing balance-Leahy Scale: Fair Standing balance comment: fair/good using RW                           ADL either performed or assessed with clinical judgement   ADL Overall  ADL's : Needs assistance/impaired                         Toilet Transfer: Min Marine scientist Details (indicate cue type and reason): simulated via ambulation from EOB and back to EOB                 Vision Baseline Vision/History: Wears glasses Wears Glasses: Reading only Patient Visual Report: No change from baseline                  Pertinent Vitals/Pain Pain Assessment: Faces Faces Pain Scale: Hurts a little bit Pain Location: right knee Pain Descriptors / Indicators: Sore;Discomfort     Hand Dominance Right   Extremity/Trunk Assessment Upper Extremity Assessment Upper Extremity Assessment: Generalized weakness   Lower Extremity Assessment Lower Extremity Assessment: Defer to PT evaluation   Cervical / Trunk Assessment Cervical / Trunk Assessment: Kyphotic   Communication Communication Communication: No difficulties   Cognition Arousal/Alertness: Awake/alert Behavior During Therapy: WFL for tasks assessed/performed Overall Cognitive Status: Within Functional Limits for tasks assessed                                                     Home Living Family/patient expects to be discharged to:: Private residence Living Arrangements: Spouse/significant other Available Help at Discharge: Family;Available 24 hours/day Type of Home: House Home Access: Stairs to enter [employer] of Steps:  2 Entrance Stairs-Rails: Left;Right (bilateral vertical post) Home Layout: One level;Laundry or work area in basement;Other (Comment)     Bathroom Shower/Tub: Occupational psychologist: Handicapped height Bathroom Accessibility: Yes   Home Equipment: Walker - 2 wheels;Grab bars - toilet;Tub bench          Prior Functioning/Environment Level of Independence: Independent        Comments: community ambulator, drives        OT Problem List: Decreased strength;Impaired balance (sitting  and/or standing)      OT Treatment/Interventions: Self-care/ADL training;Therapeutic exercise;Therapeutic activities    OT Goals(Current goals can be found in the care plan section) Acute Rehab OT Goals Patient Stated Goal: return home with family to assist OT Goal Formulation: With patient Time For Goal Achievement: 03/08/21 Potential to Achieve Goals: Good  OT Frequency: Min 2X/week               Co-evaluation PT/OT/SLP Co-Evaluation/Treatment: Yes Reason for Co-Treatment: To address functional/ADL transfers   OT goals addressed during session: ADL's and self-care      AM-PAC OT "6 Clicks" Daily Activity     Outcome Measure Help from another person eating meals?: None Help from another person taking care of personal grooming?: A Little Help from another person toileting, which includes using toliet, bedpan, or urinal?: A Little Help from another person bathing (including washing, rinsing, drying)?: A Little Help from another person to put on and taking off regular upper body clothing?: None Help from another person to put on and taking off regular lower body clothing?: A Little 6 Click Score: 20   End of Session Equipment Utilized During Treatment: Rolling walker;Gait belt  Activity Tolerance: Patient tolerated treatment well Patient left: in chair;with call bell/phone within reach;with family/visitor present  OT Visit Diagnosis: Unsteadiness on feet (R26.81);Muscle weakness (generalized) (M62.81)                Time: [id number] OT Time Calculation (min): 23 min Charges:  OT General Charges $OT Visit: 1 Visit OT Evaluation $OT Eval Low Complexity: 1 Low  Dvaughn Fickle OT, MOT  Larey Seat 02/22/2021, 9:52 AM

## 2021-02-22 NOTE — Plan of Care (Signed)
  Problem: Acute Rehab OT Goals (only OT should resolve) Goal: Pt. Will Perform Grooming Flowsheets (Taken 02/22/2021 0955) Pt Will Perform Grooming:  with modified independence  with adaptive equipment Goal: Pt. Will Perform Lower Body Dressing Flowsheets (Taken 02/22/2021 0955) Pt Will Perform Lower Body Dressing:  with modified independence  sitting/lateral leans  sit to/from stand  with adaptive equipment Goal: Pt. Will Transfer To Toilet [place] (Taken 02/22/2021 [phone]) Pt Will Transfer to Toilet:  with modified independence  stand pivot transfer Goal: Pt/Caregiver Will Perform Home Exercise Program Flowsheets (Taken 02/22/2021 [phone]) Pt/caregiver will Perform Home Exercise Program:  Increased strength  Both right and left upper extremity  Independently  [place] Kuang OT, MOT

## 2021-02-22 NOTE — Progress Notes (Signed)
Foley removed, patient successfully voided after removal. D/C instructions reviewed with patient, verbalized understanding. Dressing to right knee changed per order and IV removed. Patient to be transported to private vehicle via wheelchair. Will continue to monitor.

## 2021-02-22 NOTE — Progress Notes (Signed)
Patient ID: April Duran, female   DOB: [birth year], 80 y.o.   MRN: [id number] POD 1 S/P RIGHT TKA  BP (!) 118/53 (BP Location: Left Arm)   Pulse 63   Temp 97.8 F (36.6 C) (Oral)   Resp 18   Ht '5\' 4"'$  (1.626 m)   Wt 68.9 kg   SpO2 96%   BMI 26.07 kg/m    MS NORMAL   Df FOOT NORMAL AND PERONEAL NERVE FUNCTION NORMAL   CBC Latest Ref Rng & Units 02/22/2021 02/17/2021 06/13/2010  WBC 4.0 - 10.5 K/uL 16.4(H) 14.4(H) -  Hemoglobin 12.0 - 15.0 g/dL 10.8(L) 13.2 13.5  Hematocrit 36.0 - 46.0 % 32.0(L) 39.2 39.9  Platelets 150 - 400 K/uL 280 286 -   BMP Latest Ref Rng & Units 02/22/2021 02/17/2021 06/13/2010  Glucose 70 - 99 mg/dL 144(H) 100(H) 85  BUN 8 - 23 mg/dL '10 21 11  '$ Creatinine 0.44 - 1.00 mg/dL 0.62 0.53 0.73  Sodium 135 - 145 mmol/L 134(L) 131(L) 140  Potassium 3.5 - 5.1 mmol/L 4.4 4.1 4.4  Chloride 98 - 111 mmol/L 102 99 106  CO2 22 - 32 mmol/L '26 24 25  '$ Calcium 8.9 - 10.3 mg/dL 8.2(L) 8.8(L) 9.4    ANTICIPATE DC after PT

## 2021-02-22 NOTE — Progress Notes (Signed)
Physical Therapy Treatment Patient Details Name: April Duran MRN: [id number] DOB: 1940-10-01 Today's Date: 02/22/2021  RIGHT KNEE ROM: 0 - 110 degrees AMBULATION DISTANCE: 150 feet using RW with Supervision   History of Present Illness April Duran is a 80 y/o female, s/p Right TKA on 02/21/21 , with the diagnosis of right knee osteoarthritis.    PT Comments    Patient demonstrates increased endurance/distance for ambulation with fair/good return for right heel to toe stepping without loss of balance, able to achieve up to 110 degrees right knee flexion while self stretching seated at bed side, limited mostly due to increased pain at end range and demonstrates good return for going up/down steps in stairwell with her daughter assisting with understanding acknowledged by patient and her daughter.  Patient tolerated sitting up in chair after therapy with her daughter present in room.  Patient will benefit from continued physical therapy in hospital and recommended venue below to increase strength, balance, endurance for safe ADLs and gait.     Follow Up Recommendations  Home health PT;Supervision - Intermittent;Supervision for mobility/OOB     Equipment Recommendations  3in1 (PT)    Recommendations for Other Services       Precautions / Restrictions Precautions Precautions: Fall Restrictions Weight Bearing Restrictions: Yes RLE Weight Bearing: Weight bearing as tolerated    Mobility  Bed Mobility Overal bed mobility: Modified Independent Bed Mobility: Supine to Sit           General bed mobility comments: good return for moving RLE with minor c/o of pain    Transfers Overall transfer level: Needs assistance Equipment used: Rolling walker (2 wheeled) Transfers: Sit to/from [facility] Sit to Stand: Min guard Stand pivot transfers: Min guard       General transfer comment: slightly labored movement with good return for proper hand placement during  sit to stands after verbal cueing  Ambulation/Gait Ambulation/Gait assistance: Supervision Gait Distance (Feet): 150 Feet Assistive device: Rolling walker (2 wheeled) Gait Pattern/deviations: Decreased step length - right;Decreased step length - left;Decreased stride length;Antalgic;Trunk flexed Gait velocity: decreased   General Gait Details: increased endurance/distance for ambulation with fair/good return for right heel to toe stepping without loss of balance,, (kyphotic trunk due to scoliosis which is baseline)   Stairs Stairs: Yes Stairs assistance: Min guard Stair Management: One rail Right;One rail Left;Step to pattern Number of Stairs: 2 General stair comments: demonstrates good return for going up/down steps using 1 siderail without loss of balance   Wheelchair Mobility    Modified Rankin (Stroke Patients Only)       Balance Overall balance assessment: Needs assistance Sitting-balance support: Feet supported;No upper extremity supported Sitting balance-Leahy Scale: Good Sitting balance - Comments: seated at EOB   Standing balance support: During functional activity;Bilateral upper extremity supported Standing balance-Leahy Scale: Fair Standing balance comment: fair/good using RW                            Cognition Arousal/Alertness: Awake/alert Behavior During Therapy: WFL for tasks assessed/performed Overall Cognitive Status: Within Functional Limits for tasks assessed                                        Exercises Total Joint Exercises Ankle Circles/Pumps: Supine;10 reps;Right;Strengthening;AROM Quad Sets: Supine;10 reps;Right;Strengthening;AROM Long Arc Quad: Seated;AROM;AAROM;Right;10 reps Goniometric ROM: right knee: 0 -  110 degrees    General Comments        Pertinent Vitals/Pain Pain Assessment: Faces Faces Pain Scale: Hurts a little bit Pain Location: right knee Pain Descriptors / Indicators: Sore;Discomfort     Home Living                      Prior Function            PT Goals (current goals can now be found in the care plan section) Acute Rehab PT Goals Patient Stated Goal: return home with family to assist PT Goal Formulation: With patient/family Time For Goal Achievement: 02/23/21 Potential to Achieve Goals: Good Progress towards PT goals: Progressing toward goals    Frequency    BID      PT Plan Current plan remains appropriate    Co-evaluation              AM-PAC PT "6 Clicks" Mobility   Outcome Measure  Help needed turning from your back to your side while in a flat bed without using bedrails?: None Help needed moving from lying on your back to sitting on the side of a flat bed without using bedrails?: None Help needed moving to and from a bed to a chair (including a wheelchair)?: A Little Help needed standing up from a chair using your arms (e.g., wheelchair or bedside chair)?: A Little Help needed to walk in hospital room?: A Little Help needed climbing 3-5 steps with a railing? : A Little 6 Click Score: 20    End of Session Equipment Utilized During Treatment: Gait belt Activity Tolerance: Patient tolerated treatment well;Patient limited by fatigue Patient left: in chair;with family/visitor present Nurse Communication: Mobility status PT Visit Diagnosis: Unsteadiness on feet (R26.81);Other abnormalities of gait and mobility (R26.89);Muscle weakness (generalized) (M62.81)     Time: [id number] PT Time Calculation (min) (ACUTE ONLY): 22 min  Charges:  $Gait Training: 8-22 mins $Therapeutic Exercise: 8-22 mins                     {9:52 AM, 02/22/21 Lonell Grandchild, MPT Physical Therapist with [facility] 336 [phone] office [phone] mobile phone

## 2021-02-22 NOTE — Discharge Summary (Signed)
Physician Discharge Summary  Patient ID: April Duran MRN: [id number] DOB/AGE: [birth year] 80 y.o.  Admit date: 02/21/2021 Discharge date: 02/22/2021  Admission Diagnoses: oa rt knee   Discharge Diagnoses: same   Discharged Condition: good  Procedure: rt tka attune fb ps [id number]   Hospital Course: NAustendid very well she had uncomplicated surgery with a spinal anesthetic and a nerve block  She ambulated 100 feet plus had good pain control did the steps       Discharge Exam: BP (!) 118/53 (BP Location: Left Arm)   Pulse 63   Temp 97.8 F (36.6 C) (Oral)   Resp 18   Ht '5\' 4"'$  (1.626 m)   Wt 68.9 kg   SpO2 96%   BMI 26.07 kg/m  Physical Exam  Plantar flexion dorsiflexion intact  Mentation normal  Disposition: Discharge disposition: 01-Home or Self Care       Discharge Instructions     CPM   Complete by: As directed    Continuous passive motion machine (CPM):      Use the CPM from 0 to 90 for 4 hours per day.      You may increase by 10 per day.  You may break it up into 2 or 3 sessions per day.      Use CPM for 2 weeks or until you are told to stop.   Call MD / Call 911   Complete by: As directed    If you experience chest pain or shortness of breath, CALL 911 and be transported to the hospital emergency room.  If you develope a fever above 101 F, pus (white drainage) or increased drainage or redness at the wound, or calf pain, call your surgeon's office.   Change dressing   Complete by: As directed    YOU DO NOT HAVE TO CHANGE THE DRESSING UNLESS IT GETS SOAKED   Constipation Prevention   Complete by: As directed    Drink plenty of fluids.  Prune juice may be helpful.  You may use a stool softener, such as Colace (over the counter) 100 mg twice a day.  Use MiraLax (over the counter) for constipation as needed.   Diet - low sodium heart healthy   Complete by: As directed    Discharge instructions   Complete by: As directed    TAKE THE TRAMADOL FIRST FOR  PAIN AND THEN HYDROCODONE IF TRAMADOL IS NOT GIVING GOOD PAIN RELIEF  YOU DO NOT HAVE TO USE THE BONE FOAM (BLUE PILLOW UNDER THE KNEE   Do not put a pillow under the knee. Place it under the heel.   Complete by: As directed    Increase activity slowly as tolerated   Complete by: As directed    Post-operative opioid taper instructions:   Complete by: As directed    POST-OPERATIVE OPIOID TAPER INSTRUCTIONS: It is important to wean off of your opioid medication as soon as possible. If you do not need pain medication after your surgery it is ok to stop day one. Opioids include: Codeine, Hydrocodone(Norco, Vicodin), Oxycodone(Percocet, oxycontin) and hydromorphone amongst others.  Long term and even short term use of opiods can cause: Increased pain response Dependence Constipation Depression Respiratory depression And more.  Withdrawal symptoms can include Flu like symptoms Nausea, vomiting And more Techniques to manage these symptoms Hydrate well Eat regular healthy meals Stay active Use relaxation techniques(deep breathing, meditating, yoga) Do Not substitute Alcohol to help with tapering If you have been on opioids  for less than two weeks and do not have pain than it is ok to stop all together.  Plan to wean off of opioids This plan should start within one week post op of your joint replacement. Maintain the same interval or time between taking each dose and first decrease the dose.  Cut the total daily intake of opioids by one tablet each day Next start to increase the time between doses. The last dose that should be eliminated is the evening dose.      TED hose   Complete by: As directed    Use stockings (TED hose) for 4 weeks on BOTH leg(s).  You may remove them at night for sleeping.      Allergies as of 02/22/2021   No Known Allergies      Medication List     STOP taking these medications    aspirin 81 MG tablet Replaced by: aspirin 81 MG chewable tablet        TAKE these medications    aspirin 81 MG chewable tablet Chew 1 tablet (81 mg total) by mouth 2 (two) times daily. Replaces: aspirin 81 MG tablet   b complex vitamins tablet Take 1 tablet by mouth daily.   Biotin 10 MG Caps Take 10 mg by mouth daily.   Calcium 1200 1200-1000 MG-UNIT Chew Chew 2 mg by mouth every morning.   celecoxib 200 MG capsule Commonly known as: CELEBREX Take 1 capsule (200 mg total) by mouth 2 (two) times daily.   CoQ10 100 MG Caps Take 100 mg by mouth daily.   docusate sodium 100 MG capsule Commonly known as: COLACE Take 1 capsule (100 mg total) by mouth 2 (two) times daily.   Fish Oil 1200 MG Caps Take 2,400 mg by mouth 2 (two) times daily. With Vit D3   HYDROcodone-acetaminophen 5-325 MG tablet Commonly known as: NORCO/VICODIN Take 1-2 tablets by mouth every 4 (four) hours as needed for moderate pain (pain score 4-6).   levothyroxine 50 MCG tablet Commonly known as: SYNTHROID Take 25-50 mcg by mouth every other day. 38mg, 515m,25mcg....   lisinopril 20 MG tablet Commonly known as: ZESTRIL Take 20 mg by mouth daily.   lovastatin 20 MG tablet Commonly known as: MEVACOR Take 20 mg by mouth at bedtime.   multivitamin tablet Take 1 tablet by mouth daily.   senna 8.6 MG tablet Commonly known as: SENOKOT Take 2 tablets by mouth at bedtime.   tiZANidine 2 MG tablet Commonly known as: ZANAFLEX Take 1 tablet (2 mg total) by mouth every 6 (six) hours as needed for up to 14 days for muscle spasms.   traMADol 50 MG tablet Commonly known as: ULTRAM Take 1 tablet (50 mg total) by mouth every 6 (six) hours.   Vitamin B12 1000 MCG Tbcr Take 5,000 mcg by mouth daily.   vitamin C 1000 MG tablet Take 1,000 mg by mouth daily.   vitamin E 180 MG (400 UNITS) capsule Take 400 Units by mouth daily.               Discharge Care Instructions  (From admission, onward)           Start     Ordered   02/22/21 0000  Change  dressing       Comments: YOU DO NOT HAVE TO CHANGE THE DRESSING UNLESS IT GETS SOAKED   02/22/21 1052            Follow-up Information     [facility]  Tim Lair, MD Follow up on 03/08/2021.   Specialties: Orthopedic Surgery, Radiology Why: For wound re-check, dr h to look at incision for removal of staples Contact information: [address] [place] [address] [phone]                 Signed: Arther Abbott 02/22/2021, 10:56 AM

## 2021-02-23 ENCOUNTER — Encounter (HOSPITAL_COMMUNITY): Payer: Self-pay | Admitting: Orthopedic Surgery

## 2021-02-23 ENCOUNTER — Telehealth: Payer: Self-pay | Admitting: Orthopedic Surgery

## 2021-02-23 DIAGNOSIS — Z7982 Long term (current) use of aspirin: Secondary | ICD-10-CM | POA: Diagnosis not present

## 2021-02-23 DIAGNOSIS — E039 Hypothyroidism, unspecified: Secondary | ICD-10-CM | POA: Diagnosis not present

## 2021-02-23 DIAGNOSIS — I10 Essential (primary) hypertension: Secondary | ICD-10-CM | POA: Diagnosis not present

## 2021-02-23 DIAGNOSIS — Z471 Aftercare following joint replacement surgery: Secondary | ICD-10-CM | POA: Diagnosis not present

## 2021-02-23 DIAGNOSIS — Z791 Long term (current) use of non-steroidal anti-inflammatories (NSAID): Secondary | ICD-10-CM | POA: Diagnosis not present

## 2021-02-23 DIAGNOSIS — Z96651 Presence of right artificial knee joint: Secondary | ICD-10-CM | POA: Diagnosis not present

## 2021-02-23 DIAGNOSIS — E7800 Pure hypercholesterolemia, unspecified: Secondary | ICD-10-CM | POA: Diagnosis not present

## 2021-02-23 NOTE — Telephone Encounter (Signed)
Called to give verbal orders. Left message for Joey

## 2021-02-23 NOTE — Telephone Encounter (Signed)
Call (voice message) received from [facility] care therapist [phone], requesting verbal orders for plan of care / protocol for total knee replacement.

## 2021-02-25 DIAGNOSIS — E7800 Pure hypercholesterolemia, unspecified: Secondary | ICD-10-CM | POA: Diagnosis not present

## 2021-02-25 DIAGNOSIS — I10 Essential (primary) hypertension: Secondary | ICD-10-CM | POA: Diagnosis not present

## 2021-02-25 DIAGNOSIS — Z471 Aftercare following joint replacement surgery: Secondary | ICD-10-CM | POA: Diagnosis not present

## 2021-02-25 DIAGNOSIS — Z96651 Presence of right artificial knee joint: Secondary | ICD-10-CM | POA: Diagnosis not present

## 2021-02-25 DIAGNOSIS — E039 Hypothyroidism, unspecified: Secondary | ICD-10-CM | POA: Diagnosis not present

## 2021-02-25 DIAGNOSIS — Z7982 Long term (current) use of aspirin: Secondary | ICD-10-CM | POA: Diagnosis not present

## 2021-02-27 DIAGNOSIS — Z96651 Presence of right artificial knee joint: Secondary | ICD-10-CM | POA: Diagnosis not present

## 2021-02-27 DIAGNOSIS — E7800 Pure hypercholesterolemia, unspecified: Secondary | ICD-10-CM | POA: Diagnosis not present

## 2021-02-27 DIAGNOSIS — I10 Essential (primary) hypertension: Secondary | ICD-10-CM | POA: Diagnosis not present

## 2021-02-27 DIAGNOSIS — Z7982 Long term (current) use of aspirin: Secondary | ICD-10-CM | POA: Diagnosis not present

## 2021-02-27 DIAGNOSIS — Z471 Aftercare following joint replacement surgery: Secondary | ICD-10-CM | POA: Diagnosis not present

## 2021-02-27 DIAGNOSIS — E039 Hypothyroidism, unspecified: Secondary | ICD-10-CM | POA: Diagnosis not present

## 2021-02-27 LAB — TYPE AND SCREEN
ABO/RH(D): O NEG
Antibody Screen: NEGATIVE
Unit division: 0
Unit division: 0

## 2021-02-27 LAB — BPAM RBC
Blood Product Expiration Date: 202208182359
Blood Product Expiration Date: 202208192359
Unit Type and Rh: 9500
Unit Type and Rh: 9500

## 2021-03-01 DIAGNOSIS — Z7982 Long term (current) use of aspirin: Secondary | ICD-10-CM | POA: Diagnosis not present

## 2021-03-01 DIAGNOSIS — Z471 Aftercare following joint replacement surgery: Secondary | ICD-10-CM | POA: Diagnosis not present

## 2021-03-01 DIAGNOSIS — E039 Hypothyroidism, unspecified: Secondary | ICD-10-CM | POA: Diagnosis not present

## 2021-03-01 DIAGNOSIS — Z96651 Presence of right artificial knee joint: Secondary | ICD-10-CM | POA: Diagnosis not present

## 2021-03-01 DIAGNOSIS — I10 Essential (primary) hypertension: Secondary | ICD-10-CM | POA: Diagnosis not present

## 2021-03-01 DIAGNOSIS — E7800 Pure hypercholesterolemia, unspecified: Secondary | ICD-10-CM | POA: Diagnosis not present

## 2021-03-03 DIAGNOSIS — Z471 Aftercare following joint replacement surgery: Secondary | ICD-10-CM | POA: Diagnosis not present

## 2021-03-03 DIAGNOSIS — E039 Hypothyroidism, unspecified: Secondary | ICD-10-CM | POA: Diagnosis not present

## 2021-03-03 DIAGNOSIS — Z7982 Long term (current) use of aspirin: Secondary | ICD-10-CM | POA: Diagnosis not present

## 2021-03-03 DIAGNOSIS — E7800 Pure hypercholesterolemia, unspecified: Secondary | ICD-10-CM | POA: Diagnosis not present

## 2021-03-03 DIAGNOSIS — I10 Essential (primary) hypertension: Secondary | ICD-10-CM | POA: Diagnosis not present

## 2021-03-03 DIAGNOSIS — Z96651 Presence of right artificial knee joint: Secondary | ICD-10-CM | POA: Diagnosis not present

## 2021-03-06 DIAGNOSIS — E7800 Pure hypercholesterolemia, unspecified: Secondary | ICD-10-CM | POA: Diagnosis not present

## 2021-03-06 DIAGNOSIS — E039 Hypothyroidism, unspecified: Secondary | ICD-10-CM | POA: Diagnosis not present

## 2021-03-06 DIAGNOSIS — Z471 Aftercare following joint replacement surgery: Secondary | ICD-10-CM | POA: Diagnosis not present

## 2021-03-06 DIAGNOSIS — Z7982 Long term (current) use of aspirin: Secondary | ICD-10-CM | POA: Diagnosis not present

## 2021-03-06 DIAGNOSIS — Z96651 Presence of right artificial knee joint: Secondary | ICD-10-CM | POA: Diagnosis not present

## 2021-03-06 DIAGNOSIS — I10 Essential (primary) hypertension: Secondary | ICD-10-CM | POA: Diagnosis not present

## 2021-03-08 ENCOUNTER — Encounter: Payer: Self-pay | Admitting: Orthopedic Surgery

## 2021-03-08 ENCOUNTER — Ambulatory Visit (HOSPITAL_COMMUNITY): Payer: Medicare Other | Admitting: Physical Therapy

## 2021-03-08 ENCOUNTER — Ambulatory Visit (INDEPENDENT_AMBULATORY_CARE_PROVIDER_SITE_OTHER): Payer: Medicare Other | Admitting: Orthopedic Surgery

## 2021-03-08 ENCOUNTER — Other Ambulatory Visit: Payer: Self-pay

## 2021-03-08 DIAGNOSIS — Z96651 Presence of right artificial knee joint: Secondary | ICD-10-CM

## 2021-03-08 MED ORDER — TRAMADOL HCL 50 MG PO TABS: 50.0000 mg | ORAL_TABLET | Freq: Four times a day (QID) | ORAL | 0 refills | Status: DC

## 2021-03-08 MED ORDER — HYDROCODONE-ACETAMINOPHEN 5-325 MG PO TABS: 1.0000 | ORAL_TABLET | ORAL | 0 refills | Status: DC | PRN

## 2021-03-08 NOTE — Progress Notes (Signed)
Chief Complaint  Patient presents with   Post-op Follow-up    Right knee replacement 02/21/21    This is the first postop visit its postop day #15 status post right total knee.  This was a very difficult correction of the valgus knee but she is doing well she is at [facility] for therapy she is weaning herself off of tramadol and hydrocodone  She does complain of some medial soft tissue pain which is controlled with ice and medication  Staple line looks good we took the staples out the suture line looks great  She will continue with [facility] follow-up with me in 3 weeks I refilled her tramadol as the primary pain medication and hydrocodone for breakthrough pain  Meds ordered this encounter  Medications   traMADol (ULTRAM) 50 MG tablet    Sig: Take 1 tablet (50 mg total) by mouth every 6 (six) hours.    Dispense:  30 tablet    Refill:  0   HYDROcodone-acetaminophen (NORCO/VICODIN) 5-325 MG tablet    Sig: Take 1-2 tablets by mouth every 4 (four) hours as needed for moderate pain (pain score 4-6).    Dispense:  30 tablet    Refill:  0

## 2021-03-09 DIAGNOSIS — R2689 Other abnormalities of gait and mobility: Secondary | ICD-10-CM | POA: Diagnosis not present

## 2021-03-09 DIAGNOSIS — M1711 Unilateral primary osteoarthritis, right knee: Secondary | ICD-10-CM | POA: Diagnosis not present

## 2021-03-09 DIAGNOSIS — Z96651 Presence of right artificial knee joint: Secondary | ICD-10-CM | POA: Diagnosis not present

## 2021-03-09 DIAGNOSIS — Z471 Aftercare following joint replacement surgery: Secondary | ICD-10-CM | POA: Diagnosis not present

## 2021-03-09 DIAGNOSIS — M25561 Pain in right knee: Secondary | ICD-10-CM | POA: Diagnosis not present

## 2021-03-10 ENCOUNTER — Ambulatory Visit (HOSPITAL_COMMUNITY): Payer: Medicare Other | Admitting: Physical Therapy

## 2021-03-10 DIAGNOSIS — M1711 Unilateral primary osteoarthritis, right knee: Secondary | ICD-10-CM | POA: Diagnosis not present

## 2021-03-10 DIAGNOSIS — Z96651 Presence of right artificial knee joint: Secondary | ICD-10-CM | POA: Diagnosis not present

## 2021-03-10 DIAGNOSIS — Z471 Aftercare following joint replacement surgery: Secondary | ICD-10-CM | POA: Diagnosis not present

## 2021-03-10 DIAGNOSIS — R2689 Other abnormalities of gait and mobility: Secondary | ICD-10-CM | POA: Diagnosis not present

## 2021-03-10 DIAGNOSIS — M25561 Pain in right knee: Secondary | ICD-10-CM | POA: Diagnosis not present

## 2021-03-13 ENCOUNTER — Encounter (HOSPITAL_COMMUNITY): Payer: Medicare Other | Admitting: Physical Therapy

## 2021-03-13 DIAGNOSIS — Z471 Aftercare following joint replacement surgery: Secondary | ICD-10-CM | POA: Diagnosis not present

## 2021-03-13 DIAGNOSIS — R2689 Other abnormalities of gait and mobility: Secondary | ICD-10-CM | POA: Diagnosis not present

## 2021-03-13 DIAGNOSIS — M25561 Pain in right knee: Secondary | ICD-10-CM | POA: Diagnosis not present

## 2021-03-13 DIAGNOSIS — M1711 Unilateral primary osteoarthritis, right knee: Secondary | ICD-10-CM | POA: Diagnosis not present

## 2021-03-13 DIAGNOSIS — Z96651 Presence of right artificial knee joint: Secondary | ICD-10-CM | POA: Diagnosis not present

## 2021-03-15 ENCOUNTER — Encounter (HOSPITAL_COMMUNITY): Payer: Medicare Other | Admitting: Physical Therapy

## 2021-03-15 DIAGNOSIS — M25561 Pain in right knee: Secondary | ICD-10-CM | POA: Diagnosis not present

## 2021-03-15 DIAGNOSIS — R2689 Other abnormalities of gait and mobility: Secondary | ICD-10-CM | POA: Diagnosis not present

## 2021-03-15 DIAGNOSIS — M1711 Unilateral primary osteoarthritis, right knee: Secondary | ICD-10-CM | POA: Diagnosis not present

## 2021-03-15 DIAGNOSIS — Z96651 Presence of right artificial knee joint: Secondary | ICD-10-CM | POA: Diagnosis not present

## 2021-03-15 DIAGNOSIS — Z471 Aftercare following joint replacement surgery: Secondary | ICD-10-CM | POA: Diagnosis not present

## 2021-03-17 ENCOUNTER — Encounter (HOSPITAL_COMMUNITY): Payer: Medicare Other

## 2021-03-17 DIAGNOSIS — Z96651 Presence of right artificial knee joint: Secondary | ICD-10-CM | POA: Diagnosis not present

## 2021-03-17 DIAGNOSIS — M25561 Pain in right knee: Secondary | ICD-10-CM | POA: Diagnosis not present

## 2021-03-17 DIAGNOSIS — R2689 Other abnormalities of gait and mobility: Secondary | ICD-10-CM | POA: Diagnosis not present

## 2021-03-17 DIAGNOSIS — Z471 Aftercare following joint replacement surgery: Secondary | ICD-10-CM | POA: Diagnosis not present

## 2021-03-17 DIAGNOSIS — M1711 Unilateral primary osteoarthritis, right knee: Secondary | ICD-10-CM | POA: Diagnosis not present

## 2021-03-20 ENCOUNTER — Encounter (HOSPITAL_COMMUNITY): Payer: Medicare Other | Admitting: Physical Therapy

## 2021-03-20 DIAGNOSIS — M25561 Pain in right knee: Secondary | ICD-10-CM | POA: Diagnosis not present

## 2021-03-20 DIAGNOSIS — Z471 Aftercare following joint replacement surgery: Secondary | ICD-10-CM | POA: Diagnosis not present

## 2021-03-20 DIAGNOSIS — R2689 Other abnormalities of gait and mobility: Secondary | ICD-10-CM | POA: Diagnosis not present

## 2021-03-20 DIAGNOSIS — M1711 Unilateral primary osteoarthritis, right knee: Secondary | ICD-10-CM | POA: Diagnosis not present

## 2021-03-20 DIAGNOSIS — Z96651 Presence of right artificial knee joint: Secondary | ICD-10-CM | POA: Diagnosis not present

## 2021-03-22 ENCOUNTER — Encounter (HOSPITAL_COMMUNITY): Payer: Medicare Other

## 2021-03-22 DIAGNOSIS — Z96651 Presence of right artificial knee joint: Secondary | ICD-10-CM | POA: Diagnosis not present

## 2021-03-22 DIAGNOSIS — M25561 Pain in right knee: Secondary | ICD-10-CM | POA: Diagnosis not present

## 2021-03-22 DIAGNOSIS — R2689 Other abnormalities of gait and mobility: Secondary | ICD-10-CM | POA: Diagnosis not present

## 2021-03-22 DIAGNOSIS — Z471 Aftercare following joint replacement surgery: Secondary | ICD-10-CM | POA: Diagnosis not present

## 2021-03-22 DIAGNOSIS — M1711 Unilateral primary osteoarthritis, right knee: Secondary | ICD-10-CM | POA: Diagnosis not present

## 2021-03-24 ENCOUNTER — Encounter (HOSPITAL_COMMUNITY): Payer: Medicare Other | Admitting: Physical Therapy

## 2021-03-24 DIAGNOSIS — M25561 Pain in right knee: Secondary | ICD-10-CM | POA: Diagnosis not present

## 2021-03-24 DIAGNOSIS — Z471 Aftercare following joint replacement surgery: Secondary | ICD-10-CM | POA: Diagnosis not present

## 2021-03-24 DIAGNOSIS — Z96651 Presence of right artificial knee joint: Secondary | ICD-10-CM | POA: Diagnosis not present

## 2021-03-24 DIAGNOSIS — M1711 Unilateral primary osteoarthritis, right knee: Secondary | ICD-10-CM | POA: Diagnosis not present

## 2021-03-24 DIAGNOSIS — R2689 Other abnormalities of gait and mobility: Secondary | ICD-10-CM | POA: Diagnosis not present

## 2021-03-27 ENCOUNTER — Ambulatory Visit (INDEPENDENT_AMBULATORY_CARE_PROVIDER_SITE_OTHER): Payer: Medicare Other | Admitting: Orthopedic Surgery

## 2021-03-27 ENCOUNTER — Other Ambulatory Visit: Payer: Self-pay

## 2021-03-27 ENCOUNTER — Encounter (HOSPITAL_COMMUNITY): Payer: Medicare Other | Admitting: Physical Therapy

## 2021-03-27 DIAGNOSIS — Z471 Aftercare following joint replacement surgery: Secondary | ICD-10-CM | POA: Diagnosis not present

## 2021-03-27 DIAGNOSIS — Z96651 Presence of right artificial knee joint: Secondary | ICD-10-CM

## 2021-03-27 DIAGNOSIS — R2689 Other abnormalities of gait and mobility: Secondary | ICD-10-CM | POA: Diagnosis not present

## 2021-03-27 DIAGNOSIS — M1711 Unilateral primary osteoarthritis, right knee: Secondary | ICD-10-CM

## 2021-03-27 DIAGNOSIS — M25561 Pain in right knee: Secondary | ICD-10-CM | POA: Diagnosis not present

## 2021-03-27 NOTE — Patient Instructions (Signed)
OK TO DRIVE 

## 2021-03-27 NOTE — Progress Notes (Signed)
Chief Complaint  Patient presents with   Routine Post Op    DOS 02/21/21    This is about 5 weeks post she is doing remarkably well she does use her cane she is ready to drive her wound looks good her knee flexion is 115 degrees her knee is stable she stopped using her pain medicine I will see her in 6 weeks

## 2021-03-29 ENCOUNTER — Encounter: Payer: Medicare Other | Admitting: Orthopedic Surgery

## 2021-03-29 ENCOUNTER — Encounter (HOSPITAL_COMMUNITY): Payer: Medicare Other | Admitting: Physical Therapy

## 2021-03-29 DIAGNOSIS — M25561 Pain in right knee: Secondary | ICD-10-CM | POA: Diagnosis not present

## 2021-03-29 DIAGNOSIS — R2689 Other abnormalities of gait and mobility: Secondary | ICD-10-CM | POA: Diagnosis not present

## 2021-03-29 DIAGNOSIS — Z471 Aftercare following joint replacement surgery: Secondary | ICD-10-CM | POA: Diagnosis not present

## 2021-03-29 DIAGNOSIS — Z96651 Presence of right artificial knee joint: Secondary | ICD-10-CM | POA: Diagnosis not present

## 2021-03-29 DIAGNOSIS — M1711 Unilateral primary osteoarthritis, right knee: Secondary | ICD-10-CM | POA: Diagnosis not present

## 2021-03-31 ENCOUNTER — Encounter (HOSPITAL_COMMUNITY): Payer: Medicare Other | Admitting: Physical Therapy

## 2021-03-31 DIAGNOSIS — Z471 Aftercare following joint replacement surgery: Secondary | ICD-10-CM | POA: Diagnosis not present

## 2021-03-31 DIAGNOSIS — R2689 Other abnormalities of gait and mobility: Secondary | ICD-10-CM | POA: Diagnosis not present

## 2021-03-31 DIAGNOSIS — M25561 Pain in right knee: Secondary | ICD-10-CM | POA: Diagnosis not present

## 2021-03-31 DIAGNOSIS — M1711 Unilateral primary osteoarthritis, right knee: Secondary | ICD-10-CM | POA: Diagnosis not present

## 2021-03-31 DIAGNOSIS — Z96651 Presence of right artificial knee joint: Secondary | ICD-10-CM | POA: Diagnosis not present

## 2021-04-04 ENCOUNTER — Encounter (HOSPITAL_COMMUNITY): Payer: Medicare Other | Admitting: Physical Therapy

## 2021-04-06 ENCOUNTER — Encounter (HOSPITAL_COMMUNITY): Payer: Medicare Other | Admitting: Physical Therapy

## 2021-04-06 DIAGNOSIS — M25561 Pain in right knee: Secondary | ICD-10-CM | POA: Diagnosis not present

## 2021-04-06 DIAGNOSIS — Z96651 Presence of right artificial knee joint: Secondary | ICD-10-CM | POA: Diagnosis not present

## 2021-04-06 DIAGNOSIS — M1711 Unilateral primary osteoarthritis, right knee: Secondary | ICD-10-CM | POA: Diagnosis not present

## 2021-04-06 DIAGNOSIS — R2689 Other abnormalities of gait and mobility: Secondary | ICD-10-CM | POA: Diagnosis not present

## 2021-04-06 DIAGNOSIS — Z471 Aftercare following joint replacement surgery: Secondary | ICD-10-CM | POA: Diagnosis not present

## 2021-04-18 DIAGNOSIS — Z6828 Body mass index (BMI) 28.0-28.9, adult: Secondary | ICD-10-CM | POA: Diagnosis not present

## 2021-04-18 DIAGNOSIS — Z299 Encounter for prophylactic measures, unspecified: Secondary | ICD-10-CM | POA: Diagnosis not present

## 2021-04-18 DIAGNOSIS — Z713 Dietary counseling and surveillance: Secondary | ICD-10-CM | POA: Diagnosis not present

## 2021-04-18 DIAGNOSIS — E038 Other specified hypothyroidism: Secondary | ICD-10-CM | POA: Diagnosis not present

## 2021-04-18 DIAGNOSIS — I10 Essential (primary) hypertension: Secondary | ICD-10-CM | POA: Diagnosis not present

## 2021-05-08 ENCOUNTER — Other Ambulatory Visit: Payer: Self-pay

## 2021-05-08 ENCOUNTER — Ambulatory Visit (INDEPENDENT_AMBULATORY_CARE_PROVIDER_SITE_OTHER): Payer: Medicare Other | Admitting: Orthopedic Surgery

## 2021-05-08 DIAGNOSIS — Z96651 Presence of right artificial knee joint: Secondary | ICD-10-CM

## 2021-05-08 NOTE — Progress Notes (Signed)
Chief Complaint  Patient presents with   TKA right    Follow up DOS 02/21/21    Encounter Diagnosis  Name Primary?   S/P TKR (total knee replacement), right 02/21/21 Yes    Normal comes in 3 months after her total knee she had a severe valgus deformity  She has no pain  She is ambulatory without a cane her range of motion is 0 to 120 degrees she is stable in flexion and extension  She can come back for her annual x-ray

## 2021-05-10 DIAGNOSIS — Z23 Encounter for immunization: Secondary | ICD-10-CM | POA: Diagnosis not present

## 2021-05-11 DIAGNOSIS — C44399 Other specified malignant neoplasm of skin of other parts of face: Secondary | ICD-10-CM | POA: Diagnosis not present

## 2021-06-27 DIAGNOSIS — C44399 Other specified malignant neoplasm of skin of other parts of face: Secondary | ICD-10-CM | POA: Diagnosis not present

## 2021-07-04 DIAGNOSIS — C44329 Squamous cell carcinoma of skin of other parts of face: Secondary | ICD-10-CM | POA: Diagnosis not present

## 2021-08-01 ENCOUNTER — Other Ambulatory Visit (HOSPITAL_COMMUNITY): Payer: Self-pay | Admitting: Internal Medicine

## 2021-08-01 DIAGNOSIS — Z1231 Encounter for screening mammogram for malignant neoplasm of breast: Secondary | ICD-10-CM

## 2021-08-29 DIAGNOSIS — I10 Essential (primary) hypertension: Secondary | ICD-10-CM | POA: Diagnosis not present

## 2021-08-29 DIAGNOSIS — E782 Mixed hyperlipidemia: Secondary | ICD-10-CM | POA: Diagnosis not present

## 2021-09-11 ENCOUNTER — Ambulatory Visit (HOSPITAL_COMMUNITY)
Admission: RE | Admit: 2021-09-11 | Discharge: 2021-09-11 | Disposition: A | Discharge status: Home / Self Care | Payer: Medicare Other | Source: Ambulatory Visit | Attending: Internal Medicine | Admitting: Internal Medicine

## 2021-09-11 ENCOUNTER — Other Ambulatory Visit: Payer: Self-pay

## 2021-09-11 DIAGNOSIS — Z1231 Encounter for screening mammogram for malignant neoplasm of breast: Secondary | ICD-10-CM | POA: Diagnosis not present

## 2021-09-29 DIAGNOSIS — Z299 Encounter for prophylactic measures, unspecified: Secondary | ICD-10-CM | POA: Diagnosis not present

## 2021-09-29 DIAGNOSIS — Z0000 Encounter for general adult medical examination without abnormal findings: Secondary | ICD-10-CM | POA: Diagnosis not present

## 2021-09-29 DIAGNOSIS — Z87891 Personal history of nicotine dependence: Secondary | ICD-10-CM | POA: Diagnosis not present

## 2021-09-29 DIAGNOSIS — Z1331 Encounter for screening for depression: Secondary | ICD-10-CM | POA: Diagnosis not present

## 2021-09-29 DIAGNOSIS — Z1339 Encounter for screening examination for other mental health and behavioral disorders: Secondary | ICD-10-CM | POA: Diagnosis not present

## 2021-09-29 DIAGNOSIS — R5383 Other fatigue: Secondary | ICD-10-CM | POA: Diagnosis not present

## 2021-09-29 DIAGNOSIS — Z7189 Other specified counseling: Secondary | ICD-10-CM | POA: Diagnosis not present

## 2021-09-29 DIAGNOSIS — Z79899 Other long term (current) drug therapy: Secondary | ICD-10-CM | POA: Diagnosis not present

## 2021-09-29 DIAGNOSIS — E038 Other specified hypothyroidism: Secondary | ICD-10-CM | POA: Diagnosis not present

## 2021-09-29 DIAGNOSIS — E7800 Pure hypercholesterolemia, unspecified: Secondary | ICD-10-CM | POA: Diagnosis not present

## 2021-09-29 DIAGNOSIS — I10 Essential (primary) hypertension: Secondary | ICD-10-CM | POA: Diagnosis not present

## 2021-09-29 DIAGNOSIS — Z6826 Body mass index (BMI) 26.0-26.9, adult: Secondary | ICD-10-CM | POA: Diagnosis not present

## 2021-09-29 DIAGNOSIS — E559 Vitamin D deficiency, unspecified: Secondary | ICD-10-CM | POA: Diagnosis not present

## 2021-10-02 DIAGNOSIS — I808 Phlebitis and thrombophlebitis of other sites: Secondary | ICD-10-CM | POA: Diagnosis not present

## 2022-02-08 ENCOUNTER — Ambulatory Visit: Payer: Medicare Other | Admitting: Orthopedic Surgery

## 2022-02-15 ENCOUNTER — Ambulatory Visit (INDEPENDENT_AMBULATORY_CARE_PROVIDER_SITE_OTHER): Payer: Medicare Other

## 2022-02-15 ENCOUNTER — Ambulatory Visit (INDEPENDENT_AMBULATORY_CARE_PROVIDER_SITE_OTHER): Payer: Medicare Other | Admitting: Orthopedic Surgery

## 2022-02-15 DIAGNOSIS — Z96651 Presence of right artificial knee joint: Secondary | ICD-10-CM

## 2022-02-15 DIAGNOSIS — M1711 Unilateral primary osteoarthritis, right knee: Secondary | ICD-10-CM

## 2022-02-15 NOTE — Progress Notes (Signed)
FOLLOW UP   Encounter Diagnoses  Name Primary?   S/P TKR (total knee replacement), right 02/21/21 Yes   Unilateral primary osteoarthritis, right knee      Chief Complaint  Patient presents with   Post-op Follow-up    Right total knee 02/21/21 states she is doing well, using cane for her back.      2-year postop total knee correction of severe valgus deformity patient has no complaints  Her knee comes to full extension and she has 120 degrees of flexion her x-ray looks fine  She does not use any supportive devices  She is happy  X-ray 2 years

## 2022-04-03 DIAGNOSIS — Z6826 Body mass index (BMI) 26.0-26.9, adult: Secondary | ICD-10-CM | POA: Diagnosis not present

## 2022-04-03 DIAGNOSIS — Z713 Dietary counseling and surveillance: Secondary | ICD-10-CM | POA: Diagnosis not present

## 2022-04-03 DIAGNOSIS — Z23 Encounter for immunization: Secondary | ICD-10-CM | POA: Diagnosis not present

## 2022-04-03 DIAGNOSIS — Z299 Encounter for prophylactic measures, unspecified: Secondary | ICD-10-CM | POA: Diagnosis not present

## 2022-04-03 DIAGNOSIS — I10 Essential (primary) hypertension: Secondary | ICD-10-CM | POA: Diagnosis not present

## 2022-04-03 DIAGNOSIS — E038 Other specified hypothyroidism: Secondary | ICD-10-CM | POA: Diagnosis not present

## 2022-05-22 DIAGNOSIS — I10 Essential (primary) hypertension: Secondary | ICD-10-CM | POA: Diagnosis not present

## 2022-05-22 DIAGNOSIS — J019 Acute sinusitis, unspecified: Secondary | ICD-10-CM | POA: Diagnosis not present

## 2022-05-22 DIAGNOSIS — Z299 Encounter for prophylactic measures, unspecified: Secondary | ICD-10-CM | POA: Diagnosis not present

## 2022-08-09 ENCOUNTER — Other Ambulatory Visit (HOSPITAL_COMMUNITY): Payer: Self-pay | Admitting: Internal Medicine

## 2022-08-09 DIAGNOSIS — Z1231 Encounter for screening mammogram for malignant neoplasm of breast: Secondary | ICD-10-CM

## 2022-09-13 ENCOUNTER — Ambulatory Visit (HOSPITAL_COMMUNITY): Payer: Medicare Other

## 2022-09-14 ENCOUNTER — Encounter (HOSPITAL_COMMUNITY): Payer: Self-pay

## 2022-09-14 ENCOUNTER — Ambulatory Visit (HOSPITAL_COMMUNITY)
Admission: RE | Admit: 2022-09-14 | Discharge: 2022-09-14 | Disposition: A | Discharge status: Home / Self Care | Payer: Medicare Other | Source: Ambulatory Visit | Attending: Internal Medicine | Admitting: Internal Medicine

## 2022-09-14 DIAGNOSIS — Z1231 Encounter for screening mammogram for malignant neoplasm of breast: Secondary | ICD-10-CM | POA: Diagnosis not present

## 2022-09-14 DIAGNOSIS — Z23 Encounter for immunization: Secondary | ICD-10-CM | POA: Diagnosis not present

## 2022-10-03 DIAGNOSIS — Z79899 Other long term (current) drug therapy: Secondary | ICD-10-CM | POA: Diagnosis not present

## 2022-10-03 DIAGNOSIS — I10 Essential (primary) hypertension: Secondary | ICD-10-CM | POA: Diagnosis not present

## 2022-10-03 DIAGNOSIS — Z1339 Encounter for screening examination for other mental health and behavioral disorders: Secondary | ICD-10-CM | POA: Diagnosis not present

## 2022-10-03 DIAGNOSIS — E559 Vitamin D deficiency, unspecified: Secondary | ICD-10-CM | POA: Diagnosis not present

## 2022-10-03 DIAGNOSIS — Z6826 Body mass index (BMI) 26.0-26.9, adult: Secondary | ICD-10-CM | POA: Diagnosis not present

## 2022-10-03 DIAGNOSIS — Z7189 Other specified counseling: Secondary | ICD-10-CM | POA: Diagnosis not present

## 2022-10-03 DIAGNOSIS — E039 Hypothyroidism, unspecified: Secondary | ICD-10-CM | POA: Diagnosis not present

## 2022-10-03 DIAGNOSIS — Z0000 Encounter for general adult medical examination without abnormal findings: Secondary | ICD-10-CM | POA: Diagnosis not present

## 2022-10-03 DIAGNOSIS — Z299 Encounter for prophylactic measures, unspecified: Secondary | ICD-10-CM | POA: Diagnosis not present

## 2022-10-03 DIAGNOSIS — R5383 Other fatigue: Secondary | ICD-10-CM | POA: Diagnosis not present

## 2022-10-03 DIAGNOSIS — Z1331 Encounter for screening for depression: Secondary | ICD-10-CM | POA: Diagnosis not present

## 2022-10-03 DIAGNOSIS — E7800 Pure hypercholesterolemia, unspecified: Secondary | ICD-10-CM | POA: Diagnosis not present

## 2023-01-23 DIAGNOSIS — I10 Essential (primary) hypertension: Secondary | ICD-10-CM | POA: Diagnosis not present

## 2023-01-23 DIAGNOSIS — Z299 Encounter for prophylactic measures, unspecified: Secondary | ICD-10-CM | POA: Diagnosis not present

## 2023-01-23 DIAGNOSIS — R058 Other specified cough: Secondary | ICD-10-CM | POA: Diagnosis not present

## 2023-01-29 IMAGING — MG MM DIGITAL SCREENING BILAT W/ TOMO AND CAD
6 of 10 series · 6 of 30 positions shown · non-contrast
Comparison: Previous exam(s).

CLINICAL DATA: Screening.

EXAM:
DIGITAL SCREENING BILATERAL MAMMOGRAM WITH TOMOSYNTHESIS AND CAD
TECHNIQUE: Bilateral screening digital craniocaudal and mediolateral oblique
mammograms were obtained. Bilateral screening digital breast
tomosynthesis was performed. The images were evaluated with
computer-aided detection.

[R MLO synth-2D (1 of 2)]
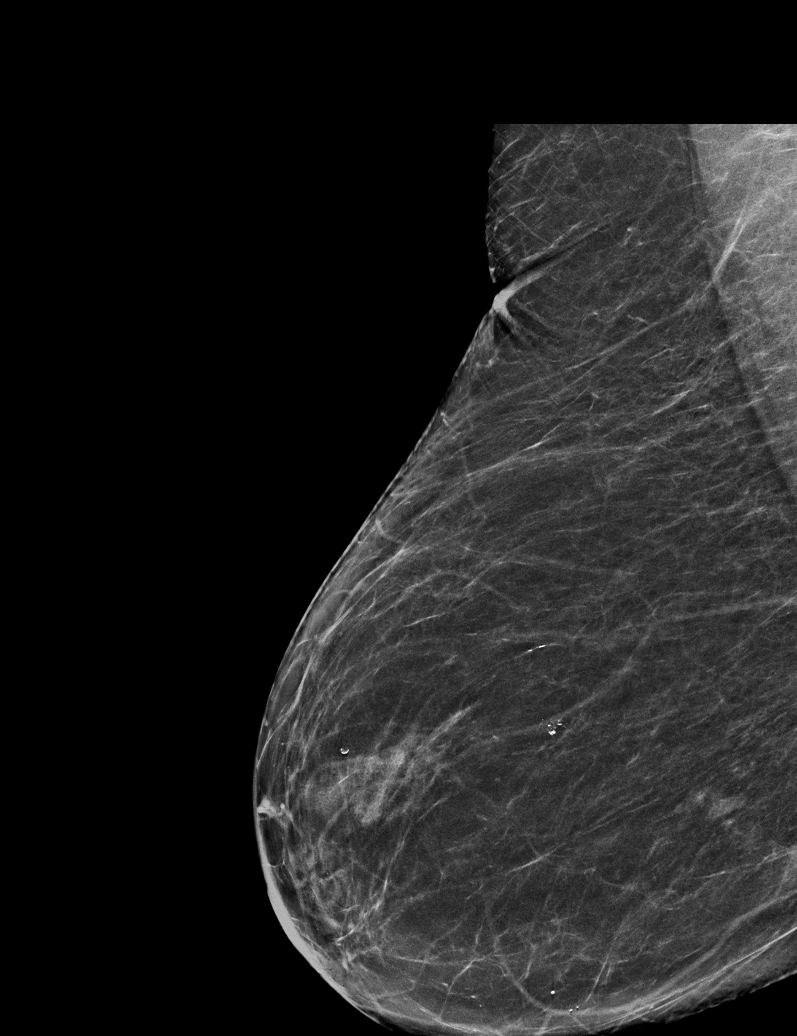

[L CC synth-2D]
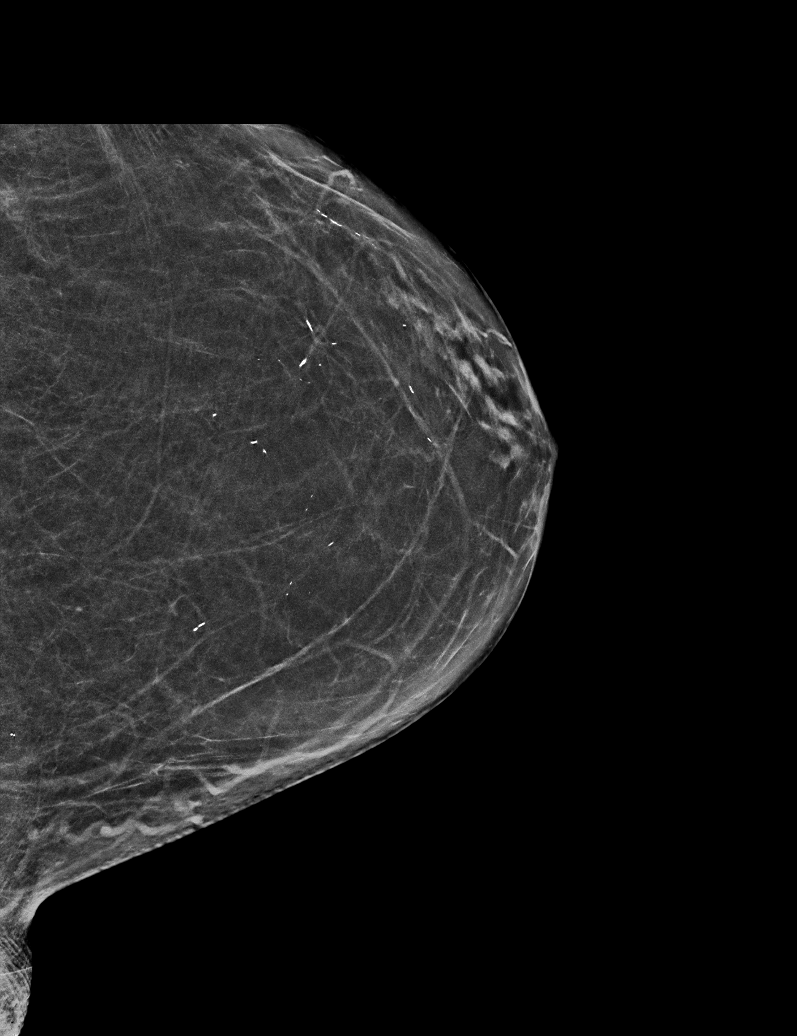

[R MLO synth-2D (2 of 2)]
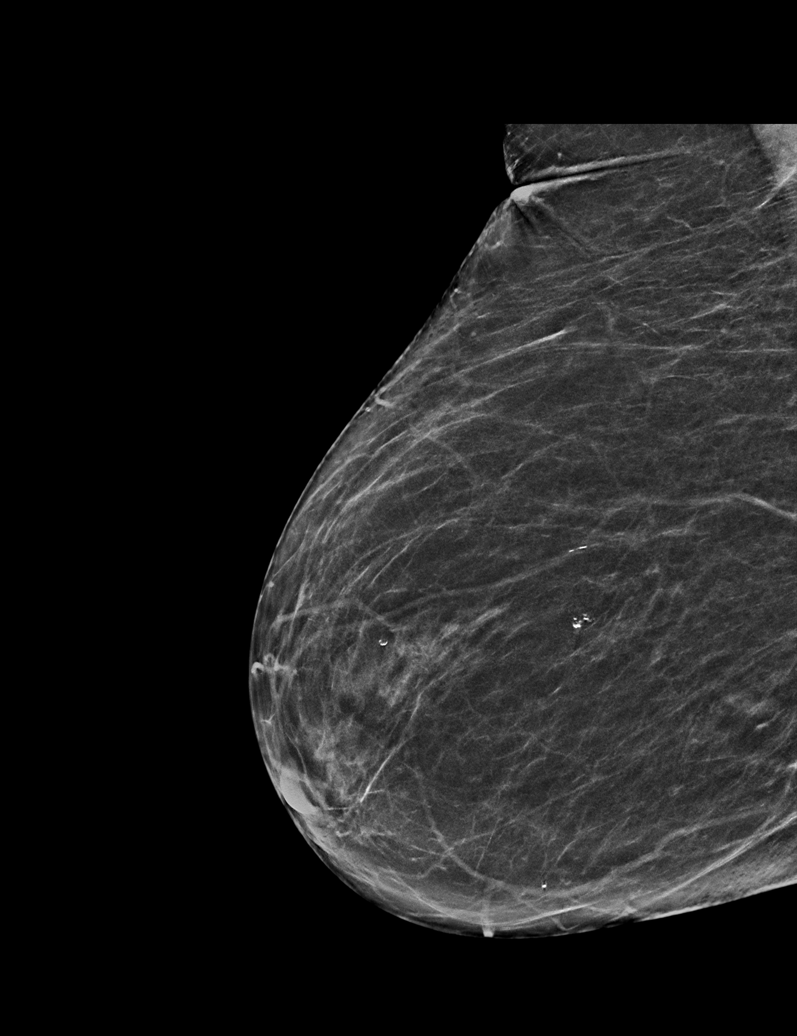

[L MLO synth-2D]
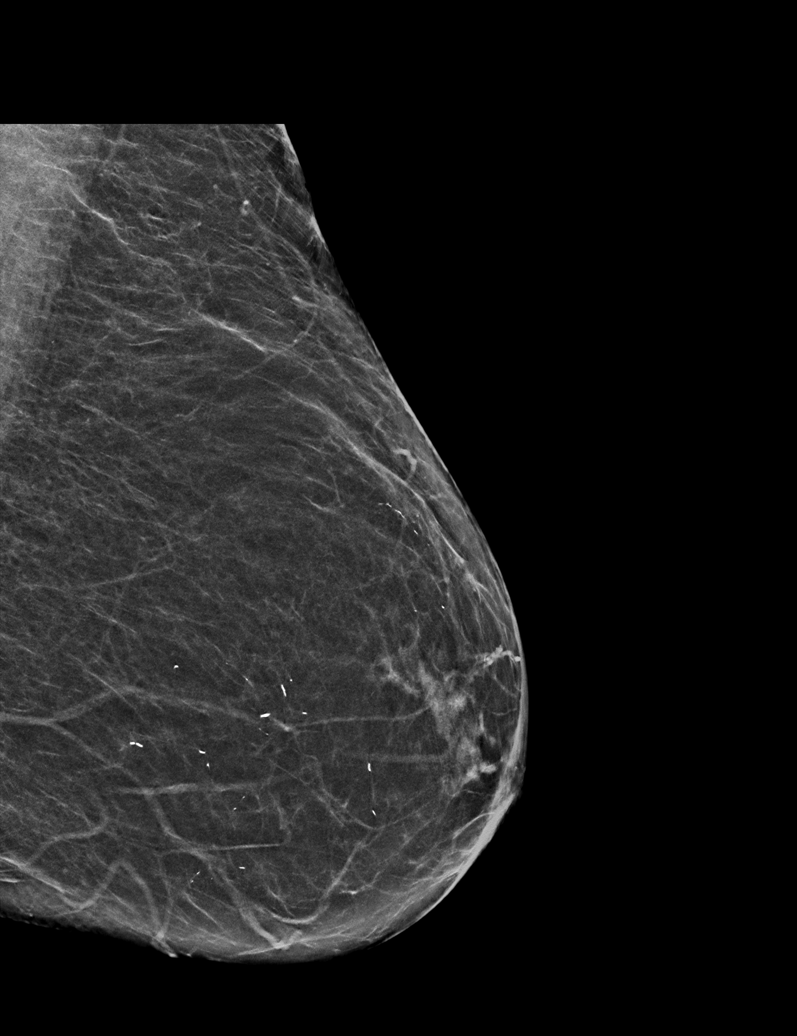

[R CC synth-2D]
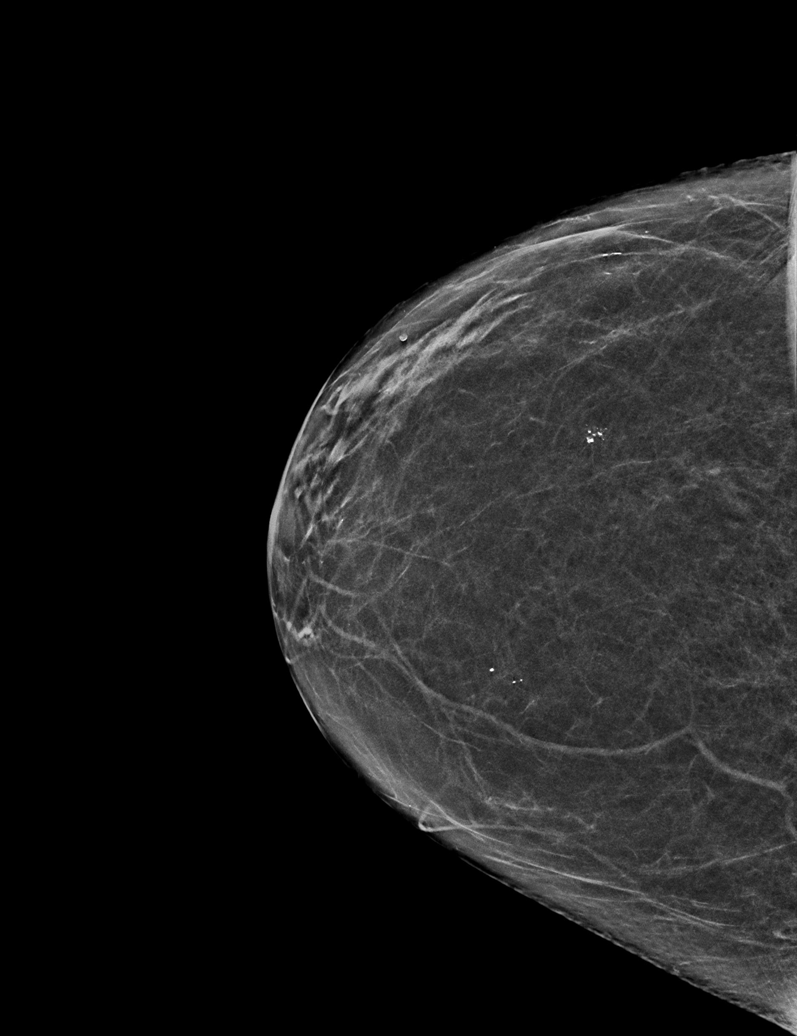

[R CC tomo · tomo slice 27/52.0]
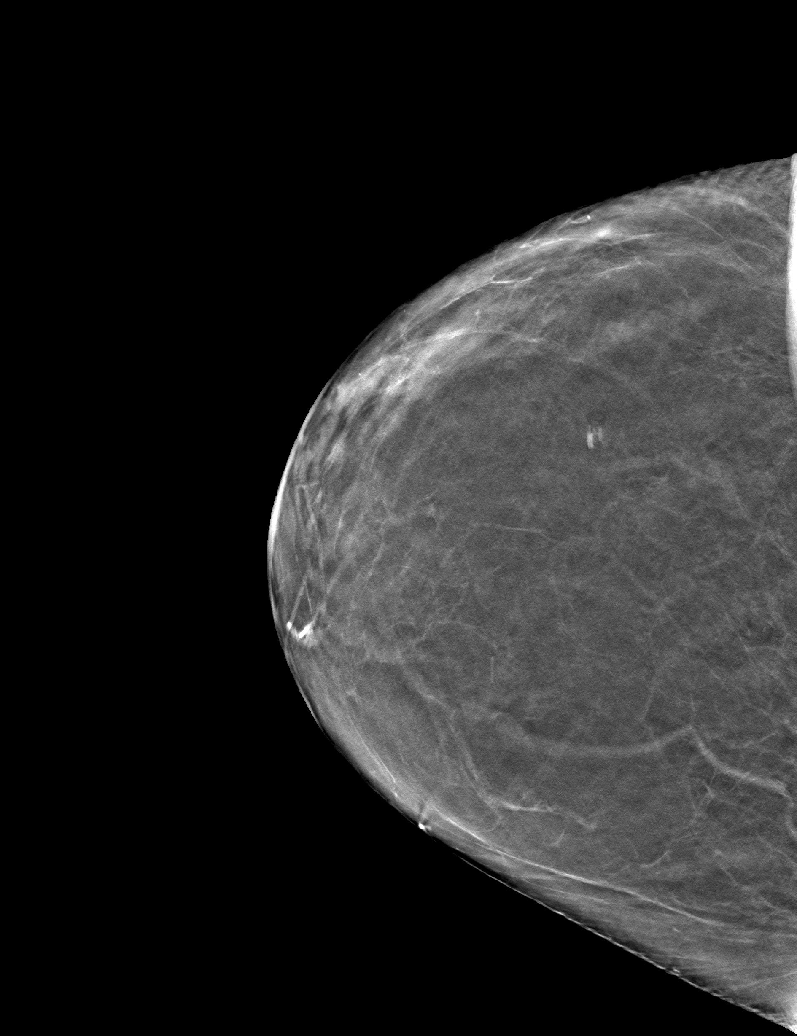

[6 of 30 positions shown; findings below may reference images not displayed]

ACR Breast Density Category b: There are scattered areas of
fibroglandular density.
FINDINGS: There are no findings suspicious for malignancy.
IMPRESSION: No mammographic evidence of malignancy. A result letter of this
screening mammogram will be mailed directly to the patient.

RECOMMENDATION:
Screening mammogram in one year. (Code:51-O-LD2)

BI-RADS CATEGORY  1: Negative.

## 2023-04-09 DIAGNOSIS — Z299 Encounter for prophylactic measures, unspecified: Secondary | ICD-10-CM | POA: Diagnosis not present

## 2023-04-09 DIAGNOSIS — E038 Other specified hypothyroidism: Secondary | ICD-10-CM | POA: Diagnosis not present

## 2023-04-09 DIAGNOSIS — I10 Essential (primary) hypertension: Secondary | ICD-10-CM | POA: Diagnosis not present

## 2023-05-15 DIAGNOSIS — Z23 Encounter for immunization: Secondary | ICD-10-CM | POA: Diagnosis not present

## 2023-07-15 IMAGING — DX DG KNEE 1-2V PORT*R*
2 series · 2 of 2 positions shown · non-contrast
Comparison: 01/23/2021

CLINICAL DATA: Status post total knee replacement, right.

EXAM:
PORTABLE RIGHT KNEE - 1-2 VIEW

[knee ap]
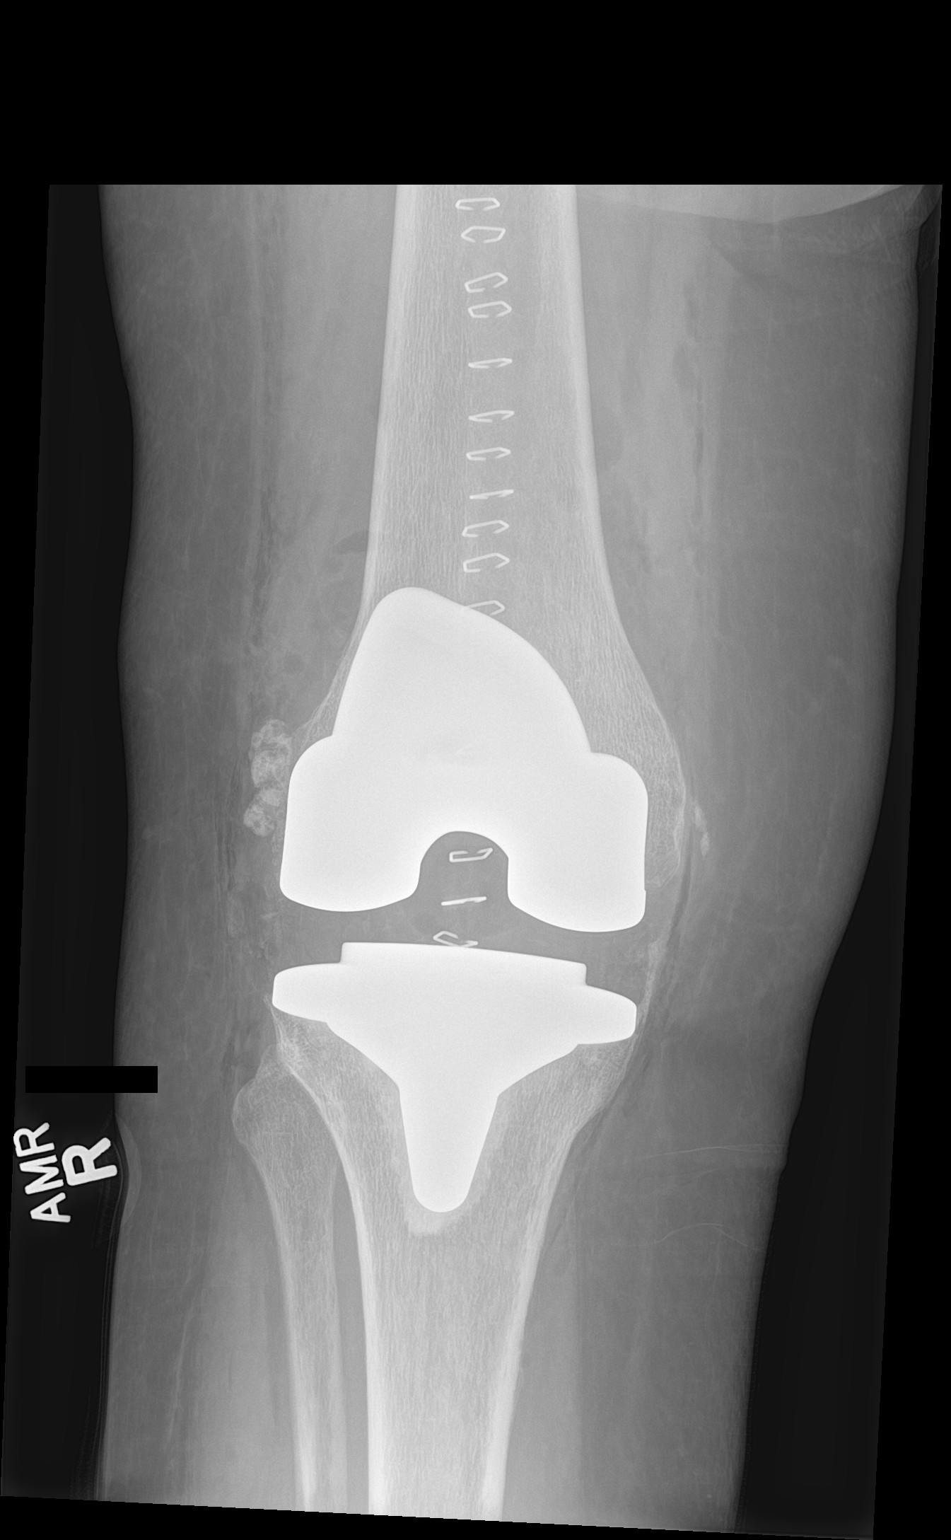

[knee obl]
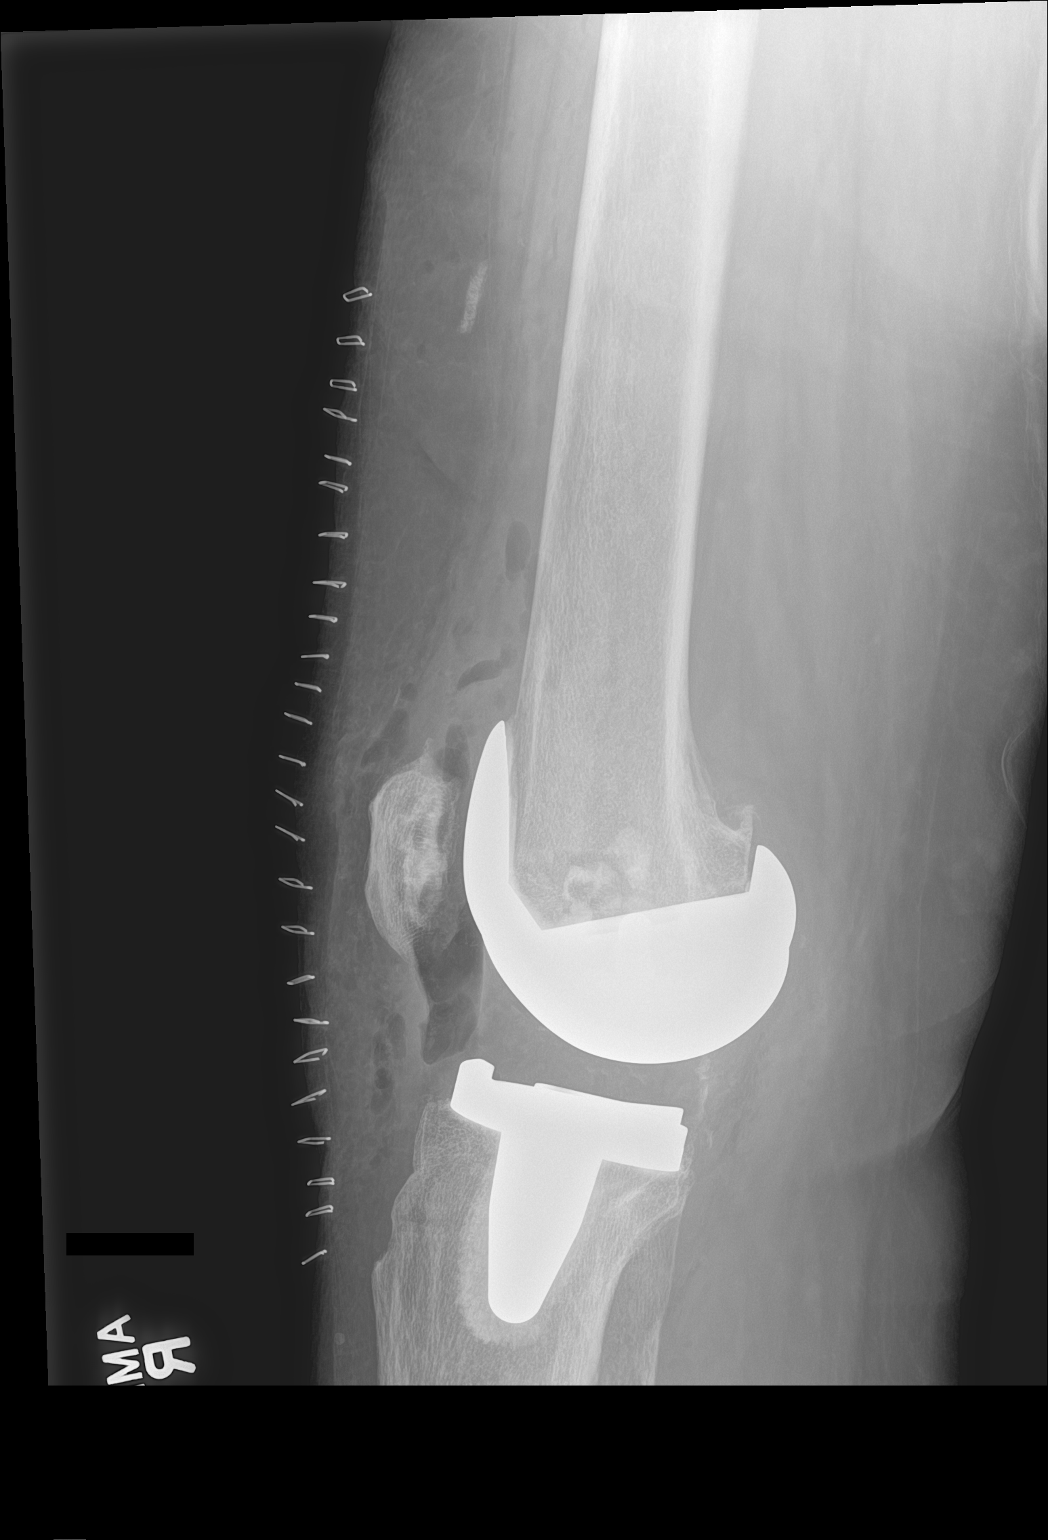

[2 of 2 positions shown; findings below may reference images not displayed]

FINDINGS: Total knee replacement is located without a periprosthetic fracture.
Skin staples along the anterior aspect of the knee. Expected lucency
in the soft tissues.
IMPRESSION: Right total knee replacement without complicating features.

## 2023-08-12 ENCOUNTER — Other Ambulatory Visit (HOSPITAL_COMMUNITY): Payer: Self-pay | Admitting: Internal Medicine

## 2023-08-12 DIAGNOSIS — Z1231 Encounter for screening mammogram for malignant neoplasm of breast: Secondary | ICD-10-CM

## 2023-09-18 ENCOUNTER — Ambulatory Visit (HOSPITAL_COMMUNITY): Payer: Medicare Other

## 2023-09-27 ENCOUNTER — Ambulatory Visit (HOSPITAL_COMMUNITY)
Admission: RE | Admit: 2023-09-27 | Discharge: 2023-09-27 | Disposition: A | Discharge status: Home / Self Care | Payer: Medicare Other | Source: Ambulatory Visit | Attending: Internal Medicine | Admitting: Internal Medicine

## 2023-09-27 ENCOUNTER — Encounter (HOSPITAL_COMMUNITY): Payer: Self-pay

## 2023-09-27 DIAGNOSIS — Z1231 Encounter for screening mammogram for malignant neoplasm of breast: Secondary | ICD-10-CM | POA: Insufficient documentation

## 2023-10-07 DIAGNOSIS — Z299 Encounter for prophylactic measures, unspecified: Secondary | ICD-10-CM | POA: Diagnosis not present

## 2023-10-07 DIAGNOSIS — Z1331 Encounter for screening for depression: Secondary | ICD-10-CM | POA: Diagnosis not present

## 2023-10-07 DIAGNOSIS — Z79899 Other long term (current) drug therapy: Secondary | ICD-10-CM | POA: Diagnosis not present

## 2023-10-07 DIAGNOSIS — E7800 Pure hypercholesterolemia, unspecified: Secondary | ICD-10-CM | POA: Diagnosis not present

## 2023-10-07 DIAGNOSIS — Z7189 Other specified counseling: Secondary | ICD-10-CM | POA: Diagnosis not present

## 2023-10-07 DIAGNOSIS — E039 Hypothyroidism, unspecified: Secondary | ICD-10-CM | POA: Diagnosis not present

## 2023-10-07 DIAGNOSIS — Z1339 Encounter for screening examination for other mental health and behavioral disorders: Secondary | ICD-10-CM | POA: Diagnosis not present

## 2023-10-07 DIAGNOSIS — Z0000 Encounter for general adult medical examination without abnormal findings: Secondary | ICD-10-CM | POA: Diagnosis not present

## 2023-10-07 DIAGNOSIS — I10 Essential (primary) hypertension: Secondary | ICD-10-CM | POA: Diagnosis not present

## 2023-10-07 DIAGNOSIS — E559 Vitamin D deficiency, unspecified: Secondary | ICD-10-CM | POA: Diagnosis not present

## 2023-10-07 DIAGNOSIS — R5383 Other fatigue: Secondary | ICD-10-CM | POA: Diagnosis not present

## 2024-02-17 ENCOUNTER — Other Ambulatory Visit (INDEPENDENT_AMBULATORY_CARE_PROVIDER_SITE_OTHER): Payer: Self-pay

## 2024-02-17 ENCOUNTER — Ambulatory Visit: Payer: Medicare Other | Admitting: Orthopedic Surgery

## 2024-02-17 VITALS — Wt 157.8 lb

## 2024-02-17 DIAGNOSIS — M1711 Unilateral primary osteoarthritis, right knee: Secondary | ICD-10-CM | POA: Diagnosis not present

## 2024-02-17 NOTE — Progress Notes (Signed)
   Wt 157 lb 12.8 oz (71.6 kg)   BMI 27.09 kg/m   Body mass index is 27.09 kg/m.  Chief Complaint  Patient presents with   Knee Pain    Follow up knee pain    Encounter Diagnosis  Name Primary?   Primary osteoarthritis of right knee Yes    DOI/DOS/ Date: 2 yr follow up right knee  April Duran is at the 3 tr f/u for right knee replacement  Surgery date February 21, 2021  She had an attune fixed-bearing posterior stabilized total knee arthroplasty she is doing well.  She has no pain she is walking well with a cane knee.  She has a 3 degree flexion contracture she has 125 degrees of flexion  Her x-ray looks normal  DG Knee AP/LAT W/Sunrise Right Result Date: 02/17/2024 Radiology report 3 views right knee AP lateral and patellar sunrise views of the knee FINDINGS: A total knee prosthesis is noted. It is in anatomic alignment. There is no evidence of loosening. Impression : normal TKA  xray      Return as needed

## 2024-02-17 NOTE — Progress Notes (Signed)
   Wt 157 lb 12.8 oz (71.6 kg)   BMI 27.09 kg/m   Body mass index is 27.09 kg/m.  Chief Complaint  Patient presents with   Knee Pain    Follow up knee pain    Encounter Diagnosis  Name Primary?   Primary osteoarthritis of right knee Yes    DOI/DOS/ Date: 2 yr follow up right knee

## 2024-05-05 DIAGNOSIS — E039 Hypothyroidism, unspecified: Secondary | ICD-10-CM | POA: Diagnosis not present

## 2024-05-05 DIAGNOSIS — I10 Essential (primary) hypertension: Secondary | ICD-10-CM | POA: Diagnosis not present

## 2024-05-05 DIAGNOSIS — Z299 Encounter for prophylactic measures, unspecified: Secondary | ICD-10-CM | POA: Diagnosis not present

## 2024-05-05 DIAGNOSIS — Z23 Encounter for immunization: Secondary | ICD-10-CM | POA: Diagnosis not present

## 2024-05-05 DIAGNOSIS — M109 Gout, unspecified: Secondary | ICD-10-CM | POA: Diagnosis not present
